# Patient Record
Sex: Female | Born: 1955 | Race: White | Hispanic: No | Marital: Married | Smoking: Never smoker
Health system: Southern US, Community
[De-identification: ages and names within clinical notes are randomized; demographics above are authoritative.]

## PROBLEM LIST (undated history)

## (undated) DIAGNOSIS — K219 Gastro-esophageal reflux disease without esophagitis: Secondary | ICD-10-CM

## (undated) DIAGNOSIS — E039 Hypothyroidism, unspecified: Secondary | ICD-10-CM

## (undated) DIAGNOSIS — G252 Other specified forms of tremor: Secondary | ICD-10-CM

## (undated) DIAGNOSIS — G709 Myoneural disorder, unspecified: Secondary | ICD-10-CM

## (undated) DIAGNOSIS — R251 Tremor, unspecified: Secondary | ICD-10-CM

## (undated) DIAGNOSIS — E7800 Pure hypercholesterolemia, unspecified: Secondary | ICD-10-CM

## (undated) DIAGNOSIS — E079 Disorder of thyroid, unspecified: Secondary | ICD-10-CM

## (undated) DIAGNOSIS — M1990 Unspecified osteoarthritis, unspecified site: Secondary | ICD-10-CM

## (undated) DIAGNOSIS — R011 Cardiac murmur, unspecified: Secondary | ICD-10-CM

## (undated) HISTORY — PX: ADENOIDECTOMY: SUR15

## (undated) HISTORY — DX: Tremor, unspecified: R25.1

---

## 2006-10-29 HISTORY — PX: COLONOSCOPY: SHX5424

## 2007-07-02 ENCOUNTER — Ambulatory Visit: Payer: Self-pay | Admitting: Unknown Physician Specialty

## 2009-03-22 ENCOUNTER — Ambulatory Visit: Payer: Self-pay | Admitting: Family Medicine

## 2009-04-20 ENCOUNTER — Ambulatory Visit: Payer: Self-pay | Admitting: Gastroenterology

## 2009-04-20 DIAGNOSIS — R933 Abnormal findings on diagnostic imaging of other parts of digestive tract: Secondary | ICD-10-CM | POA: Insufficient documentation

## 2009-04-20 DIAGNOSIS — R1013 Epigastric pain: Secondary | ICD-10-CM | POA: Insufficient documentation

## 2009-05-10 ENCOUNTER — Telehealth (INDEPENDENT_AMBULATORY_CARE_PROVIDER_SITE_OTHER): Payer: Self-pay | Admitting: *Deleted

## 2009-05-10 ENCOUNTER — Encounter: Payer: Self-pay | Admitting: Gastroenterology

## 2009-05-10 ENCOUNTER — Ambulatory Visit: Payer: Self-pay | Admitting: Gastroenterology

## 2009-05-17 ENCOUNTER — Encounter: Payer: Self-pay | Admitting: Gastroenterology

## 2009-10-19 ENCOUNTER — Ambulatory Visit: Payer: Self-pay | Admitting: Unknown Physician Specialty

## 2009-10-26 ENCOUNTER — Ambulatory Visit: Payer: Self-pay | Admitting: Unknown Physician Specialty

## 2010-06-29 IMAGING — RF DG UGI W/O KUB
1 series · 12 of 12 positions shown · non-contrast
Comparison: none

REASON FOR EXAM: H Pyloric Emesis
COMMENTS:

PROCEDURE:     FL  - FL UPPER GI  - March 22, 2009  [DATE]
RESULT:      Esophageal mucosal pattern and peristaltic activity and contour
are normal. Multiple small gastric polyps are noted. Endoscopic evaluation
is suggested. The duodenum is normal.

[Series 1: run · 12 of 12 slices shown]
[im 1/12]
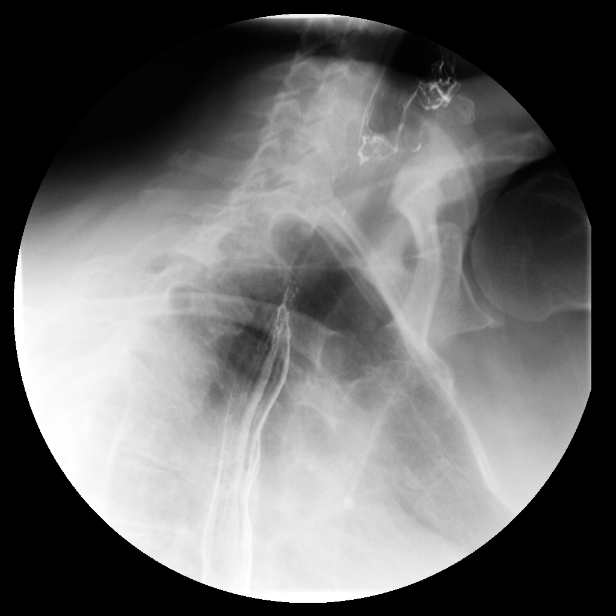
[im 2/12]
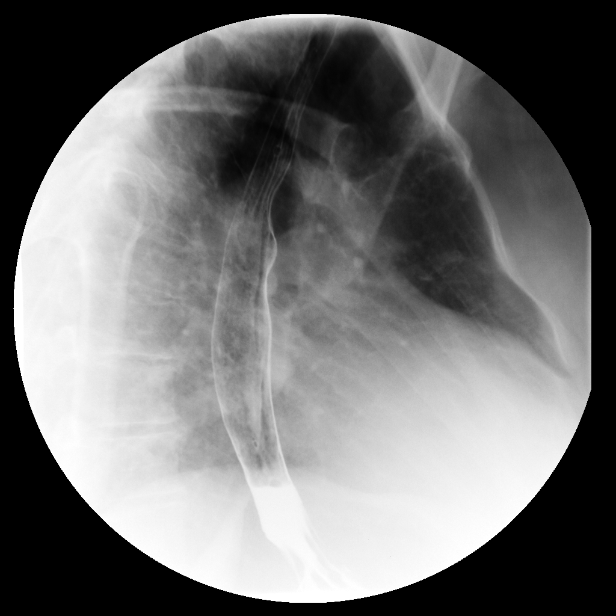
[im 3/12]
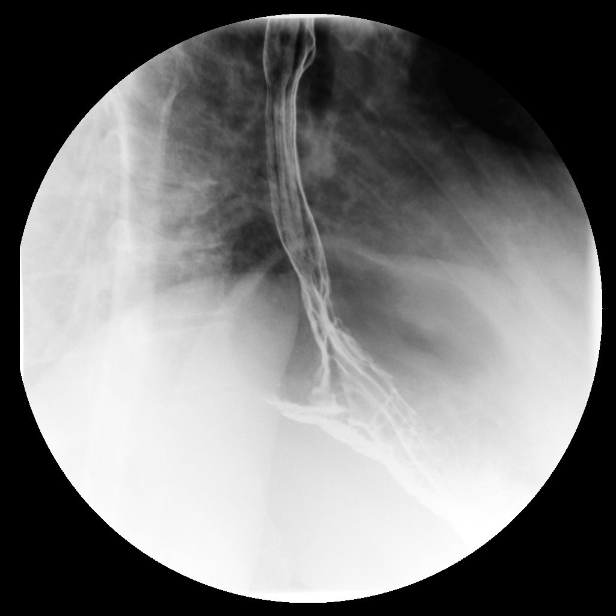
[im 4/12]
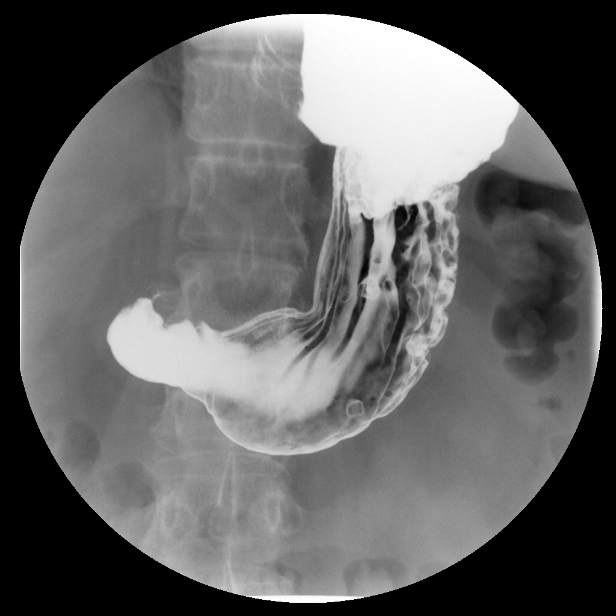
[im 5/12]
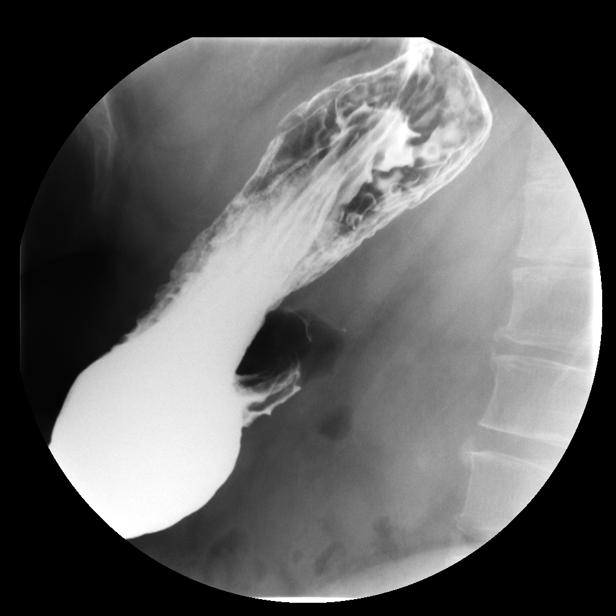
[im 6/12]
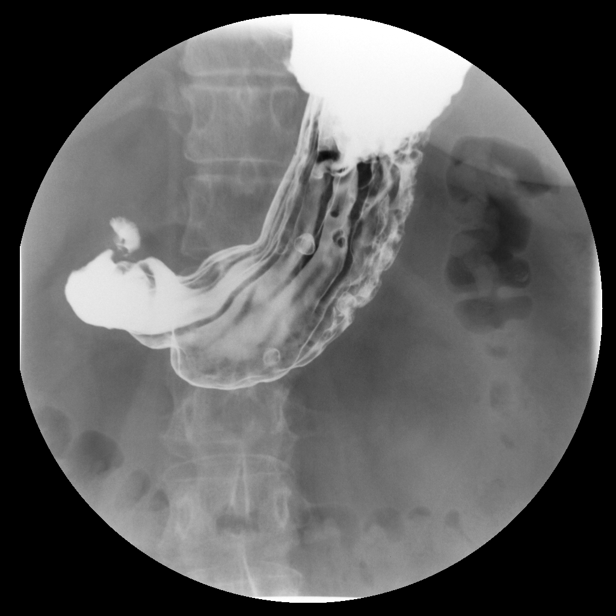
[im 7/12]
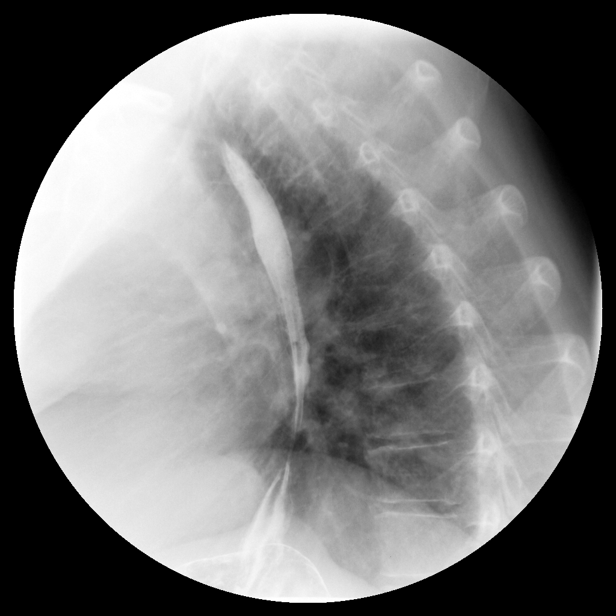
[im 8/12]
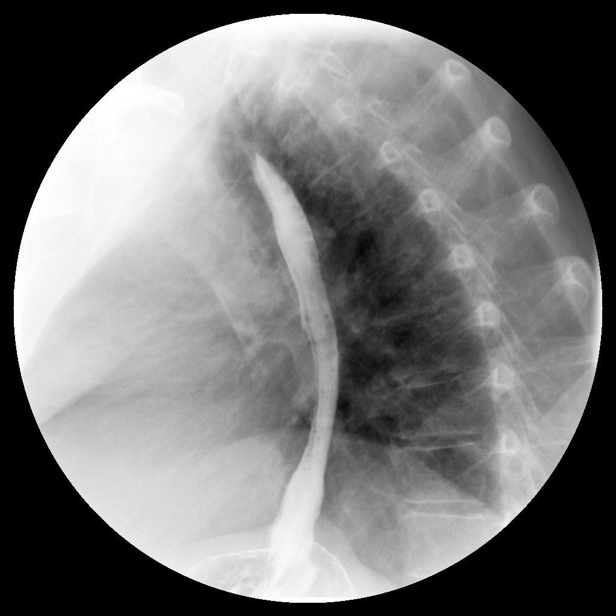
[im 9/12]
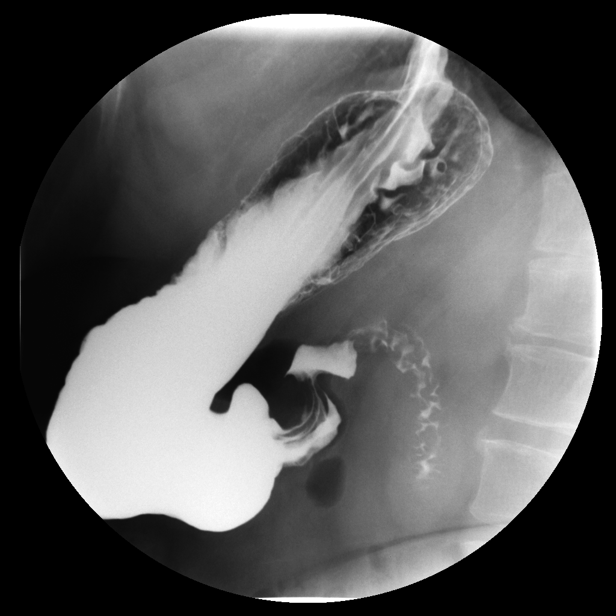
[im 10/12]
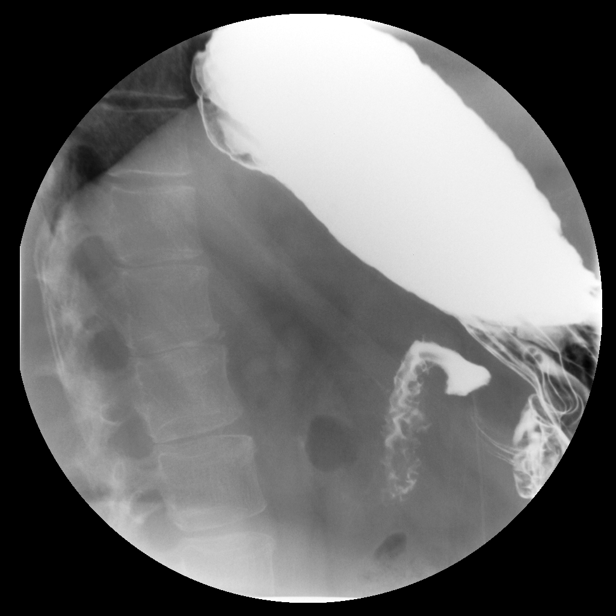
[im 11/12]
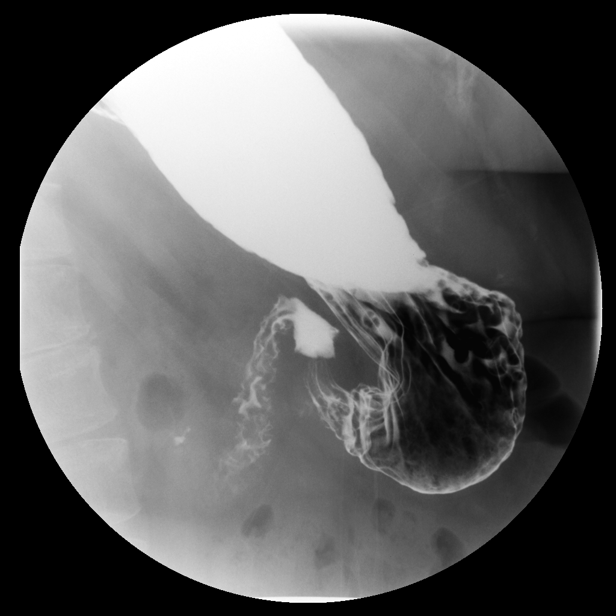
[im 12/12]
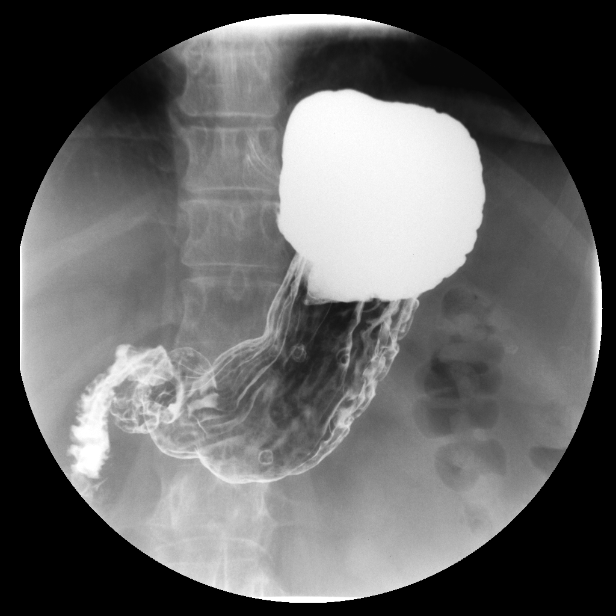

[12 of 12 positions shown; findings below may reference images not displayed]

IMPRESSION: Gastric polyposis.  Upper endoscopy suggested for further evaluation.

## 2011-07-19 ENCOUNTER — Ambulatory Visit: Payer: Self-pay | Admitting: Unknown Physician Specialty

## 2011-08-01 ENCOUNTER — Ambulatory Visit: Payer: Self-pay | Admitting: Family Medicine

## 2012-08-25 ENCOUNTER — Ambulatory Visit: Payer: Self-pay | Admitting: Medical

## 2014-02-27 ENCOUNTER — Encounter: Payer: Self-pay | Admitting: Gastroenterology

## 2014-11-16 ENCOUNTER — Ambulatory Visit: Payer: Self-pay | Admitting: Unknown Physician Specialty

## 2015-03-27 ENCOUNTER — Encounter: Payer: Self-pay | Admitting: Gynecology

## 2015-03-27 ENCOUNTER — Ambulatory Visit
Admission: EM | Admit: 2015-03-27 | Discharge: 2015-03-27 | Disposition: A | Discharge status: Home / Self Care | Payer: No Typology Code available for payment source | Attending: Family Medicine | Admitting: Family Medicine

## 2015-03-27 DIAGNOSIS — S46912A Strain of unspecified muscle, fascia and tendon at shoulder and upper arm level, left arm, initial encounter: Secondary | ICD-10-CM | POA: Diagnosis not present

## 2015-03-27 DIAGNOSIS — M7522 Bicipital tendinitis, left shoulder: Secondary | ICD-10-CM

## 2015-03-27 HISTORY — DX: Other specified forms of tremor: G25.2

## 2015-03-27 HISTORY — DX: Disorder of thyroid, unspecified: E07.9

## 2015-03-27 HISTORY — DX: Gastro-esophageal reflux disease without esophagitis: K21.9

## 2015-03-27 HISTORY — DX: Pure hypercholesterolemia, unspecified: E78.00

## 2015-03-27 MED ORDER — KETOROLAC TROMETHAMINE 10 MG PO TABS: 10.0000 mg | ORAL_TABLET | Freq: Three times a day (TID) | ORAL | Status: DC | PRN

## 2015-03-27 MED ORDER — HYDROCODONE-ACETAMINOPHEN 5-325 MG PO TABS: 1.0000 | ORAL_TABLET | Freq: Four times a day (QID) | ORAL | Status: DC | PRN

## 2015-03-27 MED ORDER — KETOROLAC TROMETHAMINE 60 MG/2ML IM SOLN
60.0000 mg | Freq: Once | INTRAMUSCULAR | Status: AC
  Administered 2015-03-27: 60 mg via INTRAMUSCULAR

## 2015-03-27 NOTE — ED Provider Notes (Signed)
CSN: [id number]     Arrival date & time 03/27/15  1201 History   First MD Initiated Contact with Patient 03/27/15 1345     Chief Complaint  Patient presents with  . Shoulder Pain   (Consider location/radiation/quality/duration/timing/severity/associated sxs/prior Treatment) Patient is a 59 y.o. female presenting with shoulder pain.  Shoulder Pain Location:  Shoulder Injury: no   Shoulder location:  L shoulder Pain details:    Quality:  Aching (worse with movement)   Radiates to:  Back   Severity:  Severe   Onset quality:  Sudden (woke up with the pain one week ago)   Progression:  Worsening Chronicity:  New Handedness:  Ambidextrous Dislocation: no   Relieved by:  NSAIDs (icy hot) Worsened by:  Movement Associated symptoms comment:  Denies chest pains or shortness of breath   Past Medical History  Diagnosis Date  . Thyroid disease   . Hypercholesteremia   . Dystonic tremor   . GERD (gastroesophageal reflux disease)    Past Surgical History  Procedure Laterality Date  . Adenoidectomy     No family history on file. History  Substance Use Topics  . Smoking status: Never Smoker   . Smokeless tobacco: Not on file  . Alcohol Use: No   OB History    No data available     Review of Systems  Allergies  Review of patient's allergies indicates no known allergies.  Home Medications   Prior to Admission medications   Medication Sig Start Date End Date Taking? Authorizing Provider  atorvastatin (LIPITOR) 10 MG tablet Take 10 mg by mouth daily.   Yes Historical Provider, MD  levothyroxine (SYNTHROID, LEVOTHROID) 112 MCG tablet Take 112 mcg by mouth daily before breakfast.   Yes Historical Provider, MD  omeprazole (PRILOSEC) 40 MG capsule Take 40 mg by mouth daily.   Yes Historical Provider, MD  propranolol (INDERAL) 80 MG tablet Take 80 mg by mouth 3 (three) times daily.   Yes Historical Provider, MD  HYDROcodone-acetaminophen (NORCO/VICODIN) 5-325 MG per tablet Take  1-2 tablets by mouth every 6 (six) hours as needed. 03/27/15   Payton Mccallumrlando Breyer Tejera, MD  ketorolac (TORADOL) 10 MG tablet Take 1 tablet (10 mg total) by mouth every 8 (eight) hours as needed. 03/27/15   Payton Mccallumrlando Nikeshia Keetch, MD   BP 124/74 mmHg  Pulse 68  Temp(Src) 98 F (36.7 C) (Oral)  Ht 5\' 4"  (1.626 m)  Wt 194 lb (87.998 kg)  BMI 33.28 kg/m2  SpO2 98% Physical Exam  Constitutional: She appears well-developed and well-nourished.  Cardiovascular: Normal rate, regular rhythm and normal heart sounds.   Pulmonary/Chest: Effort normal and breath sounds normal. No respiratory distress.  Musculoskeletal: She exhibits tenderness.       Left shoulder: She exhibits decreased range of motion, tenderness and spasm. She exhibits no swelling, no effusion, no crepitus, no deformity, no laceration, normal pulse and normal strength.  Decrease abduction of left shoulder due to pain; positive pain with biceps tendon testing; positive supraspinatus isolation test  Skin: She is not diaphoretic.  Nursing note and vitals reviewed.   ED Course  Procedures (including critical care time) Labs Review Labs Reviewed - No data to display  Imaging Review No results found.   MDM   1. Left shoulder strain, initial encounter   2. Biceps tendonitis on left    Discharge Medication List as of 03/27/2015  2:47 PM    START taking these medications   Details  HYDROcodone-acetaminophen (NORCO/VICODIN) 5-325 MG per tablet Take  1-2 tablets by mouth every 6 (six) hours as needed., Starting 03/27/2015, Until Discontinued, Print    ketorolac (TORADOL) 10 MG tablet Take 1 tablet (10 mg total) by mouth every 8 (eight) hours as needed., Starting 03/27/2015, Until Discontinued, Normal      Plan: 1. Diagnosis reviewed with patient 2. rx as per orders; risks, benefits, potential side effects reviewed with patient 3. Recommend supportive treatment with ROM exercises, gentle stretching, heat/ice 4. Patient given Toradol  IM x1 with  improvement of symptoms 5. F/u prn if symptoms worsen or don't improve    Payton Mccallum, MD 03/27/15 1451

## 2015-03-27 NOTE — ED Notes (Signed)
Patient c/o left shoulder / arm pain x 1 week ago. Pt. Stated no injury to left arm.

## 2015-04-07 ENCOUNTER — Other Ambulatory Visit: Payer: Self-pay | Admitting: Family Medicine

## 2015-04-07 DIAGNOSIS — E785 Hyperlipidemia, unspecified: Secondary | ICD-10-CM

## 2015-04-07 DIAGNOSIS — E039 Hypothyroidism, unspecified: Secondary | ICD-10-CM

## 2015-07-05 ENCOUNTER — Other Ambulatory Visit: Payer: Self-pay | Admitting: Family Medicine

## 2015-07-05 DIAGNOSIS — E039 Hypothyroidism, unspecified: Secondary | ICD-10-CM

## 2015-07-05 DIAGNOSIS — E785 Hyperlipidemia, unspecified: Secondary | ICD-10-CM

## 2015-07-08 ENCOUNTER — Other Ambulatory Visit: Payer: Self-pay | Admitting: Family Medicine

## 2015-07-25 ENCOUNTER — Encounter: Payer: Self-pay | Admitting: Family Medicine

## 2015-07-25 ENCOUNTER — Ambulatory Visit (INDEPENDENT_AMBULATORY_CARE_PROVIDER_SITE_OTHER): Payer: No Typology Code available for payment source | Admitting: Family Medicine

## 2015-07-25 VITALS — BP 100/62 | HR 68 | Ht 66.0 in | Wt 200.0 lb

## 2015-07-25 DIAGNOSIS — E785 Hyperlipidemia, unspecified: Secondary | ICD-10-CM

## 2015-07-25 DIAGNOSIS — K219 Gastro-esophageal reflux disease without esophagitis: Secondary | ICD-10-CM | POA: Diagnosis not present

## 2015-07-25 DIAGNOSIS — R1310 Dysphagia, unspecified: Secondary | ICD-10-CM | POA: Diagnosis not present

## 2015-07-25 DIAGNOSIS — K120 Recurrent oral aphthae: Secondary | ICD-10-CM

## 2015-07-25 DIAGNOSIS — E039 Hypothyroidism, unspecified: Secondary | ICD-10-CM | POA: Diagnosis not present

## 2015-07-25 MED ORDER — ATORVASTATIN CALCIUM 10 MG PO TABS: ORAL_TABLET | ORAL | Status: DC

## 2015-07-25 MED ORDER — OMEPRAZOLE 40 MG PO CPDR: 40.0000 mg | DELAYED_RELEASE_CAPSULE | Freq: Every day | ORAL | Status: DC

## 2015-07-25 MED ORDER — LEVOTHYROXINE SODIUM 112 MCG PO TABS
ORAL_TABLET | ORAL | Status: DC
Start: 2015-07-25 — End: 2016-05-03

## 2015-07-25 MED ORDER — VALACYCLOVIR HCL 500 MG PO TABS: 500.0000 mg | ORAL_TABLET | Freq: Two times a day (BID) | ORAL | Status: DC

## 2015-07-25 NOTE — Progress Notes (Signed)
Name: Cheryl Campos   MRN: [id number]    DOB: Jun 17, 1956   Date:07/25/2015       Progress Note  Subjective  Chief Complaint  Chief Complaint  Patient presents with  . Hypothyroidism  . Hyperlipidemia  . Mouth Lesions    takes Valacyclovir    Hyperlipidemia This is a chronic problem. The current episode started more than 1 year ago. The problem is controlled. Recent lipid tests were reviewed and are normal. She has no history of chronic renal disease, diabetes, hypothyroidism, liver disease, obesity or nephrotic syndrome. There are no known factors aggravating her hyperlipidemia. Pertinent negatives include no chest pain, focal sensory loss, focal weakness, leg pain, myalgias or shortness of breath. Current antihyperlipidemic treatment includes statins. The current treatment provides mild improvement of lipids. There are no compliance problems.   Mouth Lesions  The problem has been gradually improving. The problem is mild. The symptoms are relieved by one or more prescription drugs. Associated symptoms include mouth sores. Pertinent negatives include no fever, no decreased vision, no abdominal pain, no constipation, no diarrhea, no nausea, no ear discharge, no ear pain, no headaches, no sore throat, no neck pain, no cough, no wheezing and no rash.  Thyroid Problem Presents for follow-up visit. Symptoms include tremors. Patient reports no anxiety, cold intolerance, constipation, depressed mood, diaphoresis, diarrhea, fatigue, heat intolerance, hoarse voice, menstrual problem, palpitations, visual change, weight gain or weight loss. The symptoms have been stable. Past treatments include levothyroxine. The treatment provided mild relief. Her past medical history is significant for hyperlipidemia. There is no history of diabetes.  Gastrophageal Reflux She complains of dysphagia. She reports no abdominal pain, no belching, no chest pain, no choking, no coughing, no early satiety, no globus sensation,  no heartburn, no hoarse voice, no nausea, no sore throat, no stridor, no tooth decay or no wheezing. dysphagia x2-3 yearss. The problem occurs frequently. The problem has been waxing and waning. The symptoms are aggravated by certain foods. Pertinent negatives include no fatigue, melena or weight loss.    No problem-specific assessment & plan notes found for this encounter.   Past Medical History  Diagnosis Date  . Thyroid disease   . Hypercholesteremia   . Dystonic tremor   . GERD (gastroesophageal reflux disease)   . Tremors of nervous system     Past Surgical History  Procedure Laterality Date  . Adenoidectomy    . Colonoscopy  2008    [place]    Family History  Problem Relation Age of Onset  . Cancer Father   . Heart disease Father   . Cancer Brother   . Diabetes Paternal Grandmother     Social History   Social History  . Marital Status: Married    Spouse Name: N/A  . Number of Children: N/A  . Years of Education: N/A   Occupational History  . Not on file.   Social History Main Topics  . Smoking status: Never Smoker   . Smokeless tobacco: Not on file  . Alcohol Use: No  . Drug Use: No  . Sexual Activity: Yes   Other Topics Concern  . Not on file   Social History Narrative    No Known Allergies   Review of Systems  Constitutional: Negative for fever, chills, weight loss, weight gain, malaise/fatigue, diaphoresis and fatigue.  HENT: Positive for mouth sores. Negative for ear discharge, ear pain, hoarse voice and sore throat.   Eyes: Negative for blurred vision.  Respiratory: Negative for cough,  sputum production, choking, shortness of breath and wheezing.   Cardiovascular: Negative for chest pain, palpitations and leg swelling.  Gastrointestinal: Positive for dysphagia. Negative for heartburn, nausea, abdominal pain, diarrhea, constipation, blood in stool and melena.  Genitourinary: Negative for dysuria, urgency, frequency, hematuria and menstrual  problem.  Musculoskeletal: Negative for myalgias, back pain, joint pain and neck pain.  Skin: Negative for rash.  Neurological: Positive for tremors. Negative for dizziness, tingling, sensory change, focal weakness and headaches.  Endo/Heme/Allergies: Negative for environmental allergies, cold intolerance, heat intolerance and polydipsia. Does not bruise/bleed easily.  Psychiatric/Behavioral: Negative for depression and suicidal ideas. The patient is not nervous/anxious and does not have insomnia.      Objective  Filed Vitals:   07/25/15 1003  BP: 100/62  Pulse: 68  Height:  (1.676 m)  Weight: 200 lb (90.719 kg)    Physical Exam  Constitutional: She is well-developed, well-nourished, and in no distress. No distress.  HENT:  Head: Normocephalic and atraumatic.  Right Ear: External ear normal.  Left Ear: External ear normal.  Nose: Nose normal.  Mouth/Throat: Oropharynx is clear and moist.  Eyes: Conjunctivae and EOM are normal. Pupils are equal, round, and reactive to light. Right eye exhibits no discharge. Left eye exhibits no discharge.  Neck: Normal range of motion. Neck supple. No JVD present. No thyromegaly present.  Cardiovascular: Normal rate, regular rhythm, normal heart sounds and intact distal pulses.  Exam reveals no gallop and no friction rub.   No murmur heard. Pulmonary/Chest: Effort normal and breath sounds normal.  Abdominal: Soft. Bowel sounds are normal. She exhibits no mass. There is no tenderness. There is no guarding.  Musculoskeletal: Normal range of motion. She exhibits no edema.  Lymphadenopathy:    She has no cervical adenopathy.  Neurological: She is alert. She has normal reflexes.  Skin: Skin is warm and dry. She is not diaphoretic.  Psychiatric: Mood and affect normal.      Assessment & Plan  Problem List Items Addressed This Visit    None    Visit Diagnoses    Hyperlipidemia    -  Primary    Relevant Medications    atorvastatin  (LIPITOR) 10 MG tablet    Other Relevant Orders    Renal Function Panel    Lipid Profile    Gastroesophageal reflux disease, esophagitis presence not specified        also notes dysphagia/ pt to call with GI info    Relevant Medications    omeprazole (PRILOSEC) 40 MG capsule    Hypothyroidism, unspecified hypothyroidism type        Relevant Medications    levothyroxine (SYNTHROID, LEVOTHROID) 112 MCG tablet    Other Relevant Orders    TSH    Stomatitis herpetiformis        Relevant Medications    valACYclovir (VALTREX) 500 MG tablet    Dysphagia        pt to call with GI info         Dr. Hayden [facility]  07/25/2015

## 2015-07-26 LAB — RENAL FUNCTION PANEL
ALBUMIN: 4.5 g/dL (ref 3.5–5.5)
BUN/Creatinine Ratio: 22 (ref 9–23)
BUN: 12 mg/dL (ref 6–24)
CHLORIDE: 102 mmol/L (ref 97–108)
CO2: 26 mmol/L (ref 18–29)
Calcium: 9.3 mg/dL (ref 8.7–10.2)
Creatinine, Ser: 0.55 mg/dL — ABNORMAL LOW (ref 0.57–1.00)
GFR, EST AFRICAN AMERICAN: 120 mL/min/{1.73_m2} (ref >59)
GFR, EST NON AFRICAN AMERICAN: 104 mL/min/{1.73_m2} (ref >59)
Glucose: 96 mg/dL (ref 65–99)
PHOSPHORUS: 3.8 mg/dL (ref 2.5–4.5)
Potassium: 4.5 mmol/L (ref 3.5–5.2)
Sodium: 143 mmol/L (ref 134–144)

## 2015-07-26 LAB — LIPID PANEL
CHOL/HDL RATIO: 3.5 ratio (ref 0.0–4.4)
Cholesterol, Total: 177 mg/dL (ref 100–199)
HDL: 50 mg/dL (ref >39)
LDL Calculated: 95 mg/dL (ref 0–99)
Triglycerides: 159 mg/dL — ABNORMAL HIGH (ref 0–149)
VLDL CHOLESTEROL CAL: 32 mg/dL (ref 5–40)

## 2015-07-26 LAB — TSH: TSH: 1.36 u[IU]/mL (ref 0.450–4.500)

## 2015-08-05 ENCOUNTER — Other Ambulatory Visit: Payer: Self-pay | Admitting: Family Medicine

## 2016-01-30 ENCOUNTER — Encounter: Payer: Self-pay | Admitting: Family Medicine

## 2016-01-30 ENCOUNTER — Ambulatory Visit (INDEPENDENT_AMBULATORY_CARE_PROVIDER_SITE_OTHER): Payer: Managed Care, Other (non HMO) | Admitting: Family Medicine

## 2016-01-30 VITALS — BP 118/80 | HR 78 | Ht 66.0 in | Wt 209.0 lb

## 2016-01-30 DIAGNOSIS — R0789 Other chest pain: Secondary | ICD-10-CM | POA: Diagnosis not present

## 2016-01-30 DIAGNOSIS — R000 Tachycardia, unspecified: Secondary | ICD-10-CM

## 2016-01-30 DIAGNOSIS — R079 Chest pain, unspecified: Secondary | ICD-10-CM

## 2016-01-30 NOTE — Progress Notes (Signed)
Name: Cheryl Campos   MRN: [id number]    DOB: 1956-10-04   Date:01/30/2016       Progress Note  Subjective  Chief Complaint  Chief Complaint  Patient presents with  . Tachycardia    wakes up in the middle of night with heart racing, feeling jittery and nervous- has had 12 episodes. As long as "I'm busy, I don't feel it."    Chest Pain  This is a new problem. The current episode started 1 to 4 weeks ago. The onset quality is sudden. The problem occurs intermittently. The problem has been gradually worsening. The pain is present in the substernal region. The pain is at a severity of 6/10. The pain is moderate. The quality of the pain is described as heavy (sharp). The pain radiates to the left jaw and left neck. Associated symptoms include dizziness, exertional chest pressure and palpitations. Pertinent negatives include no abdominal pain, back pain, cough, diaphoresis, fever, headaches, malaise/fatigue, nausea, shortness of breath or sputum production. Past medical history comments: heart murmur Prior diagnostic workup includes echocardiogram and stress echo.  Heart Problem This is a new (tachycardia) problem. The current episode started 1 to 4 weeks ago. The problem occurs every several days. Associated symptoms include chest pain and neck pain. Pertinent negatives include no abdominal pain, chills, coughing, diaphoresis, fever, headaches, myalgias, nausea, rash or sore throat. Nothing aggravates the symptoms. She has tried nothing for the symptoms.    No problem-specific assessment & plan notes found for this encounter.   Past Medical History  Diagnosis Date  . Thyroid disease   . Hypercholesteremia   . Dystonic tremor   . GERD (gastroesophageal reflux disease)   . Tremors of nervous system     Past Surgical History  Procedure Laterality Date  . Adenoidectomy    . Colonoscopy  2008    [place]    Family History  Problem Relation Age of Onset  . Cancer Father   . Heart disease  Father   . Cancer Brother   . Diabetes Paternal Grandmother     Social History   Social History  . Marital Status: Married    Spouse Name: N/A  . Number of Children: N/A  . Years of Education: N/A   Occupational History  . Not on file.   Social History Main Topics  . Smoking status: Never Smoker   . Smokeless tobacco: Not on file  . Alcohol Use: No  . Drug Use: No  . Sexual Activity: Yes   Other Topics Concern  . Not on file   Social History Narrative    No Known Allergies   Review of Systems  Constitutional: Negative for fever, chills, weight loss, malaise/fatigue and diaphoresis.  HENT: Negative for ear discharge, ear pain and sore throat.   Eyes: Negative for blurred vision.  Respiratory: Negative for cough, sputum production, shortness of breath and wheezing.   Cardiovascular: Positive for chest pain and palpitations. Negative for leg swelling.  Gastrointestinal: Negative for heartburn, nausea, abdominal pain, diarrhea, constipation, blood in stool and melena.  Genitourinary: Negative for dysuria, urgency, frequency and hematuria.  Musculoskeletal: Positive for neck pain. Negative for myalgias, back pain and joint pain.  Skin: Negative for rash.  Neurological: Positive for dizziness. Negative for tingling, sensory change, focal weakness and headaches.  Endo/Heme/Allergies: Negative for environmental allergies and polydipsia. Does not bruise/bleed easily.  Psychiatric/Behavioral: Negative for depression and suicidal ideas. The patient is not nervous/anxious and does not have insomnia.  Objective  Filed Vitals:   01/30/16 1429  BP: 118/80  Pulse: 78  Height:  (1.676 m)  Weight: 209 lb (94.802 kg)    Physical Exam  Constitutional: She is well-developed, well-nourished, and in no distress. No distress.  HENT:  Head: Normocephalic and atraumatic.  Right Ear: External ear normal.  Left Ear: External ear normal.  Nose: Nose normal.  Mouth/Throat:  Oropharynx is clear and moist.  Eyes: Conjunctivae and EOM are normal. Pupils are equal, round, and reactive to light. Right eye exhibits no discharge. Left eye exhibits no discharge.  Neck: Normal range of motion. Neck supple. No JVD present. No thyromegaly present.  Cardiovascular: Normal rate, regular rhythm, normal heart sounds and intact distal pulses.  Exam reveals no gallop and no friction rub.   No murmur heard. Pulmonary/Chest: Effort normal and breath sounds normal. No respiratory distress. She has no wheezes. She has no rales. She exhibits no tenderness.  Abdominal: Soft. Bowel sounds are normal. She exhibits no mass. There is no tenderness. There is no guarding.  Musculoskeletal: Normal range of motion. She exhibits no edema.  Lymphadenopathy:    She has no cervical adenopathy.  Neurological: She is alert. She has normal reflexes.  Skin: Skin is warm and dry. She is not diaphoretic.  Psychiatric: Mood and affect normal.  Nursing note and vitals reviewed.     Assessment & Plan  Problem List Items Addressed This Visit    None    Visit Diagnoses    Tachycardia    -  Primary    Relevant Orders    EKG 12-Lead (Completed)    Ambulatory referral to Cardiology    Thyroid Panel With TSH    Chest pain of uncertain etiology        Relevant Orders    Ambulatory referral to Cardiology         Dr. Elizabeth Sauer [facility]  01/30/2016

## 2016-01-31 LAB — THYROID PANEL WITH TSH
FREE THYROXINE INDEX: 3.5 (ref 1.2–4.9)
T3 Uptake Ratio: 29 % (ref 24–39)
T4, Total: 11.9 ug/dL (ref 4.5–12.0)
TSH: 2.33 u[IU]/mL (ref 0.450–4.500)

## 2016-02-09 ENCOUNTER — Other Ambulatory Visit: Payer: Self-pay

## 2016-05-03 ENCOUNTER — Other Ambulatory Visit: Payer: Self-pay

## 2016-05-03 DIAGNOSIS — K219 Gastro-esophageal reflux disease without esophagitis: Secondary | ICD-10-CM

## 2016-05-03 DIAGNOSIS — E039 Hypothyroidism, unspecified: Secondary | ICD-10-CM

## 2016-05-03 MED ORDER — LEVOTHYROXINE SODIUM 112 MCG PO TABS: ORAL_TABLET | ORAL | Status: DC

## 2016-05-03 MED ORDER — OMEPRAZOLE 40 MG PO CPDR: 40.0000 mg | DELAYED_RELEASE_CAPSULE | Freq: Every day | ORAL | Status: DC

## 2016-05-15 ENCOUNTER — Other Ambulatory Visit: Payer: Self-pay

## 2016-05-15 DIAGNOSIS — E785 Hyperlipidemia, unspecified: Secondary | ICD-10-CM

## 2016-05-15 MED ORDER — ATORVASTATIN CALCIUM 10 MG PO TABS
ORAL_TABLET | ORAL | Status: DC
Start: 2016-05-15 — End: 2016-06-11

## 2016-06-11 ENCOUNTER — Ambulatory Visit (INDEPENDENT_AMBULATORY_CARE_PROVIDER_SITE_OTHER): Payer: Managed Care, Other (non HMO) | Admitting: Family Medicine

## 2016-06-11 ENCOUNTER — Encounter: Payer: Self-pay | Admitting: Family Medicine

## 2016-06-11 VITALS — BP 120/70 | HR 78 | Ht 66.0 in | Wt 207.0 lb

## 2016-06-11 DIAGNOSIS — K120 Recurrent oral aphthae: Secondary | ICD-10-CM | POA: Diagnosis not present

## 2016-06-11 DIAGNOSIS — G252 Other specified forms of tremor: Secondary | ICD-10-CM | POA: Diagnosis not present

## 2016-06-11 DIAGNOSIS — E039 Hypothyroidism, unspecified: Secondary | ICD-10-CM | POA: Diagnosis not present

## 2016-06-11 DIAGNOSIS — R251 Tremor, unspecified: Secondary | ICD-10-CM | POA: Insufficient documentation

## 2016-06-11 DIAGNOSIS — B001 Herpesviral vesicular dermatitis: Secondary | ICD-10-CM | POA: Diagnosis not present

## 2016-06-11 DIAGNOSIS — K219 Gastro-esophageal reflux disease without esophagitis: Secondary | ICD-10-CM | POA: Diagnosis not present

## 2016-06-11 DIAGNOSIS — E785 Hyperlipidemia, unspecified: Secondary | ICD-10-CM

## 2016-06-11 MED ORDER — ATORVASTATIN CALCIUM 10 MG PO TABS: ORAL_TABLET | ORAL | 6 refills | Status: DC

## 2016-06-11 MED ORDER — OMEPRAZOLE 40 MG PO CPDR: 40.0000 mg | DELAYED_RELEASE_CAPSULE | Freq: Every day | ORAL | 6 refills | Status: DC

## 2016-06-11 MED ORDER — VALACYCLOVIR HCL 500 MG PO TABS: 500.0000 mg | ORAL_TABLET | Freq: Two times a day (BID) | ORAL | 1 refills | Status: DC

## 2016-06-11 MED ORDER — LEVOTHYROXINE SODIUM 112 MCG PO TABS: ORAL_TABLET | ORAL | 6 refills | Status: DC

## 2016-06-11 NOTE — Progress Notes (Signed)
Name: Cheryl Campos   MRN: [id number]    DOB: 09/12/1956   Date:06/11/2016       Progress Note  Subjective  Chief Complaint  Chief Complaint  Patient presents with  . Gastroesophageal Reflux  . Hypothyroidism  . Hyperlipidemia    Patient presents for tremor control. Valtrex for occ fever blister   Gastroesophageal Reflux  She reports no abdominal pain, no belching, no chest pain, no choking, no coughing, no dysphagia, no early satiety, no globus sensation, no heartburn, no hoarse voice, no nausea, no sore throat, no stridor, no tooth decay, no water brash or no wheezing. This is a chronic problem. The current episode started more than 1 year ago. The problem has been gradually improving. The symptoms are aggravated by certain foods. Pertinent negatives include no anemia, fatigue, melena, muscle weakness, orthopnea or weight loss. There are no known risk factors. She has tried a PPI for the symptoms. The treatment provided no relief.  Hyperlipidemia  This is a chronic problem. The problem is controlled. Recent lipid tests were reviewed and are normal. Pertinent negatives include no chest pain, focal weakness, myalgias or shortness of breath. Current antihyperlipidemic treatment includes statins. The current treatment provides moderate improvement of lipids. There are no compliance problems.  Risk factors for coronary artery disease include post-menopausal.    No problem-specific Assessment & Plan notes found for this encounter.   Past Medical History:  Diagnosis Date  . Dystonic tremor   . GERD (gastroesophageal reflux disease)   . Hypercholesteremia   . Thyroid disease   . Tremors of nervous system     Past Surgical History:  Procedure Laterality Date  . ADENOIDECTOMY    . COLONOSCOPY  2008   [place]    Family History  Problem Relation Age of Onset  . Cancer Father   . Heart disease Father   . Cancer Brother   . Diabetes Paternal Grandmother     Social History    Social History  . Marital status: Married    Spouse name: N/A  . Number of children: N/A  . Years of education: N/A   Occupational History  . Not on file.   Social History Main Topics  . Smoking status: Never Smoker  . Smokeless tobacco: Not on file  . Alcohol use No  . Drug use: No  . Sexual activity: Yes   Other Topics Concern  . Not on file   Social History Narrative  . No narrative on file    No Known Allergies   Review of Systems  Constitutional: Negative for chills, fatigue, fever, malaise/fatigue and weight loss.  HENT: Negative for ear discharge, ear pain, hoarse voice and sore throat.   Eyes: Negative for blurred vision.  Respiratory: Negative for cough, sputum production, choking, shortness of breath and wheezing.   Cardiovascular: Negative for chest pain, palpitations and leg swelling.  Gastrointestinal: Negative for abdominal pain, blood in stool, constipation, diarrhea, dysphagia, heartburn, melena and nausea.  Genitourinary: Negative for dysuria, frequency, hematuria and urgency.  Musculoskeletal: Negative for back pain, joint pain, myalgias, muscle weakness and neck pain.  Skin: Negative for rash.  Neurological: Negative for dizziness, tingling, sensory change, focal weakness and headaches.  Endo/Heme/Allergies: Negative for environmental allergies and polydipsia. Does not bruise/bleed easily.  Psychiatric/Behavioral: Negative for depression and suicidal ideas. The patient is not nervous/anxious and does not have insomnia.      Objective  Vitals:   06/11/16 1018  BP: 120/70  Pulse: 78  Weight:  207 lb (93.9 kg)  Height: 5\' 6"  (1.676 m)    Physical Exam  Constitutional: She is well-developed, well-nourished, and in no distress. No distress.  HENT:  Head: Normocephalic and atraumatic.  Right Ear: External ear normal.  Left Ear: External ear normal.  Nose: Nose normal.  Mouth/Throat: Oropharynx is clear and moist.  Eyes: Conjunctivae and EOM  are normal. Pupils are equal, round, and reactive to light. Right eye exhibits no discharge. Left eye exhibits no discharge.  Neck: Normal range of motion. Neck supple. No JVD present. No thyromegaly present.  Cardiovascular: Normal rate, regular rhythm, normal heart sounds and intact distal pulses.  Exam reveals no gallop and no friction rub.   No murmur heard. Pulmonary/Chest: Effort normal and breath sounds normal. No respiratory distress. She has no wheezes. She has no rales. She exhibits no tenderness.  Abdominal: Soft. Bowel sounds are normal. She exhibits no mass. There is no tenderness. There is no guarding.  Musculoskeletal: Normal range of motion. She exhibits no edema, tenderness or deformity.  Lymphadenopathy:    She has no cervical adenopathy.  Neurological: She is alert. She has normal reflexes.  Skin: Skin is warm and dry. No rash noted. She is not diaphoretic. No erythema. No pallor.  Psychiatric: Mood and affect normal.  Nursing note and vitals reviewed.     Assessment & Plan  Problem List Items Addressed This Visit      Digestive   Esophageal reflux   Relevant Medications   omeprazole (PRILOSEC) 40 MG capsule   Fever blister   Relevant Medications   valACYclovir (VALTREX) 500 MG tablet     Endocrine   Thyroid activity decreased   Relevant Medications   levothyroxine (SYNTHROID, LEVOTHROID) 112 MCG tablet   Other Relevant Orders   Thyroid Panel With TSH     Other   Dystonic tremor   Hyperlipidemia - Primary   Relevant Medications   atorvastatin (LIPITOR) 10 MG tablet   Other Relevant Orders   Lipid Profile    Other Visit Diagnoses    Stomatitis herpetiformis       Relevant Medications   valACYclovir (VALTREX) 500 MG tablet        Dr. Hayden Rasmusseneanna Jones [facility]  06/11/16

## 2016-06-12 LAB — THYROID PANEL WITH TSH
FREE THYROXINE INDEX: 3.1 (ref 1.2–4.9)
T3 UPTAKE RATIO: 31 % (ref 24–39)
T4 TOTAL: 10 ug/dL (ref 4.5–12.0)
TSH: 3.05 u[IU]/mL (ref 0.450–4.500)

## 2016-06-12 LAB — LIPID PANEL
CHOLESTEROL TOTAL: 165 mg/dL (ref 100–199)
Chol/HDL Ratio: 3.3 ratio units (ref 0.0–4.4)
HDL: 50 mg/dL (ref >39)
LDL Calculated: 88 mg/dL (ref 0–99)
Triglycerides: 136 mg/dL (ref 0–149)
VLDL Cholesterol Cal: 27 mg/dL (ref 5–40)

## 2017-02-25 ENCOUNTER — Other Ambulatory Visit: Payer: Self-pay | Admitting: Family Medicine

## 2017-02-25 DIAGNOSIS — E785 Hyperlipidemia, unspecified: Secondary | ICD-10-CM

## 2017-03-13 ENCOUNTER — Emergency Department (HOSPITAL_COMMUNITY)
Admission: EM | Admit: 2017-03-13 | Discharge: 2017-03-14 | Disposition: A | Discharge status: Home / Self Care | Payer: Managed Care, Other (non HMO) | Attending: Emergency Medicine | Admitting: Emergency Medicine

## 2017-03-13 DIAGNOSIS — R55 Syncope and collapse: Secondary | ICD-10-CM | POA: Diagnosis not present

## 2017-03-13 DIAGNOSIS — Z79899 Other long term (current) drug therapy: Secondary | ICD-10-CM | POA: Insufficient documentation

## 2017-03-13 NOTE — ED Provider Notes (Signed)
[facility]-EMERGENCY DEPT Provider Note   CSN: [id number] Arrival date & time: 03/13/17  2312  By signing my name below, I, Cheryl Campos, attest that this documentation has been prepared under the direction and in the presence of Cheryl Campos, Cheryl Greenlaw, MD. Electronically Signed: Karren CobbleNy'kea Campos, ED Scribe. 03/14/17. 12:10 AM.  Time seen 23:26 PM  History   Chief Complaint Chief Complaint  Patient presents with  . Near Syncope  . Nausea  . Abdominal Pain   The history is provided by the patient and a relative. No language interpreter was used.  Near Syncope  This is a new problem. The current episode started less than 1 hour ago. The problem has not changed since onset.Associated symptoms include abdominal pain.  Abdominal Pain   Associated symptoms include nausea. Pertinent negatives include diarrhea.    HPI Comments: Cheryl Campos is a 61 y.o. female with a PMHx of Thyroid diease, GERD and HLD, who presents to the Emergency Department complaining of a sudden onset of near fainting episode while waiting with family member in the ED. Her associated symptoms include centralized abdominal pain with the feeling she needs to have a BM, nausea, and sweats. Pt states she was standing in there ED room with her mother when her abdomen began hurt. She went to the restroom but was uncomfortable about using the public bathroom and decided to return to the room when she broken into a sweat and felt like she was going to pass out. Afterwards she notes having a near fainting episode. She does not note any recent changed but states she did eat later than normal. Per pt's sister, she had a similar episode a few months ago and notes when this happened it was secondary to her eating dinner later than she normally does. Pt also notes she has been more stressed than normal due to family issues. At this time all symptoms have resolved aside from feeling like she needs to go to the restroom to have a bowel movement. Denies diarrhea,  dizziness, loss of consciousness, or light headedness.   PCP- Elizabeth Sauereanna Jones   Past Medical History:  Diagnosis Date  . Dystonic tremor   . GERD (gastroesophageal reflux disease)   . Hypercholesteremia   . Thyroid disease   . Tremors of nervous system     Patient Active Problem List   Diagnosis Date Noted  . Dystonic tremor 06/11/2016  . Esophageal reflux 06/11/2016  . Hyperlipidemia 06/11/2016  . Fever blister 06/11/2016  . Thyroid activity decreased 06/11/2016  . EPIGASTRIC PAIN 04/20/2009  . NONSPECIFIC ABN FINDING RAD & OTH EXAM GI TRACT 04/20/2009    Past Surgical History:  Procedure Laterality Date  . ADENOIDECTOMY    . COLONOSCOPY  2008   [place]    OB History    No data available       Home Medications    Prior to Admission medications   Medication Sig Start Date End Date Taking? Authorizing Provider  atorvastatin (LIPITOR) 10 MG tablet TAKE (1) TABLET BY MOUTH EVERY DAY 02/25/17  Yes Duanne LimerickJones, Deanna C, MD  levothyroxine (SYNTHROID, LEVOTHROID) 112 MCG tablet TAKE 1 TABLET ON AN EMPTY STOMACH WITH A GLASS OF WATER AT LEAST 30 TO 60 MINUTES BEFORE BREAKFAST. 06/11/16  Yes Duanne LimerickJones, Deanna C, MD  omeprazole (PRILOSEC) 40 MG capsule Take 1 capsule (40 mg total) by mouth daily. 06/11/16  Yes Duanne LimerickJones, Deanna C, MD  propranolol (INDERAL) 80 MG tablet Take 80 mg by mouth 2 (two) times daily. Dr  Sherryll Burger   Yes [provider]  valACYclovir (VALTREX) 500 MG tablet Take 1 tablet (500 mg total) by mouth 2 (two) times daily. As needed for flare up Patient not taking: Reported on 03/14/2017 06/11/16   Duanne Limerick, MD    Family History Family History  Problem Relation Age of Onset  . Cancer Father   . Heart disease Father   . Cancer Brother   . Diabetes Paternal Grandmother     Social History Social History  Substance Use Topics  . Smoking status: Never Smoker  . Smokeless tobacco: Not on file  . 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   Review of Systems Review of Systems  Constitutional: Positive for diaphoresis.  Cardiovascular: Positive for near-syncope.  Gastrointestinal: Positive for abdominal pain and nausea. Negative for diarrhea.  Neurological: Positive for syncope (near). Negative for dizziness and light-headedness.  All other systems reviewed and are negative.   Physical Exam Updated Vital Signs BP (!) 148/83 (BP Location: Right Arm)   SpO2 97%   ED Triage Vitals  Enc Vitals Group     BP 03/13/17 2315 (!) 148/83     Pulse Rate 03/13/17 2320 62     Resp 03/13/17 2330 18     Temp --      Temp src --      SpO2 03/13/17 2317 97 %     Weight --      Height --      Head Circumference --      Peak Flow --      Pain Score --      Pain Loc --      Pain Edu? --      Excl. in GC? --    Vital signs normal    Physical Exam  Constitutional: She is oriented to person, place, and time. She appears well-developed and well-nourished.  Non-toxic appearance. She does not appear ill. No distress.  HENT:  Head: Normocephalic and atraumatic.  Right Ear: External ear normal.  Left Ear: External ear normal.  Nose: Nose normal. No mucosal edema or rhinorrhea.  Mouth/Throat: Oropharynx is clear and moist and mucous membranes are normal. No dental abscesses or uvula swelling.  Eyes: Conjunctivae and EOM are normal. Pupils are equal, round, and reactive to light.  Neck: Normal range of motion and full passive range of motion without pain. Neck supple.  Cardiovascular: Normal rate, regular rhythm and normal heart sounds.  Exam reveals no gallop and no friction rub.   No murmur heard. Pulmonary/Chest: Effort normal and breath sounds normal. No respiratory distress. She has no wheezes. She has no rhonchi. She has no rales. She exhibits no tenderness and no crepitus.  Abdominal: Soft. Normal appearance and bowel sounds are normal. She exhibits no distension.  There is no tenderness. There is no rebound and no guarding.  Musculoskeletal: Normal range of motion. She exhibits no edema or tenderness.  Moves all extremities well.   Neurological: She is alert and oriented to person, place, and time. She has normal strength. No cranial nerve deficit.  Skin: Skin is warm, dry and intact. No rash noted. No erythema. No pallor.  Psychiatric: She has a normal mood and affect. Her speech is normal and behavior is normal. Her mood appears not anxious.  Nursing note and vitals reviewed.   ED Treatments / Results  Labs (all labs ordered are listed, but  only abnormal results are displayed) Results for orders placed or performed during the hospital encounter of 03/13/17  Basic metabolic panel  Result Value Ref Range   Sodium 139 135 - 145 mmol/L   Potassium 4.7 3.5 - 5.1 mmol/L   Chloride 107 101 - 111 mmol/L   CO2 23 22 - 32 mmol/L   Glucose, Bld 109 (H) 65 - 99 mg/dL   BUN 12 6 - 20 mg/dL   Creatinine, Ser 3.24 0.44 - 1.00 mg/dL   Calcium 9.3 8.9 - 40.1 mg/dL   GFR calc non Af Amer >60 >60 mL/min   GFR calc Af Amer >60 >60 mL/min   Anion gap 9 5 - 15  CBC with Differential  Result Value Ref Range   WBC 12.4 (H) 4.0 - 10.5 K/uL   RBC 4.74 3.87 - 5.11 MIL/uL   Hemoglobin 13.5 12.0 - 15.0 g/dL   HCT 02.7 25.3 - 66.4 %   MCV 88.2 78.0 - 100.0 fL   MCH 28.5 26.0 - 34.0 pg   MCHC 32.3 30.0 - 36.0 g/dL   RDW 40.3 47.4 - 25.9 %   Platelets 232 150 - 400 K/uL   Neutrophils Relative % 70 %   Neutro Abs 8.7 (H) 1.7 - 7.7 K/uL   Lymphocytes Relative 22 %   Lymphs Abs 2.7 0.7 - 4.0 K/uL   Monocytes Relative 7 %   Monocytes Absolute 0.9 0.1 - 1.0 K/uL   Eosinophils Relative 1 %   Eosinophils Absolute 0.1 0.0 - 0.7 K/uL   Basophils Relative 0 %   Basophils Absolute 0.0 0.0 - 0.1 K/uL   Laboratory interpretation all normal except leukocytosis    EKG  EKG Interpretation  Date/Time:  Wednesday Mar 13 2017 23:21:02 EDT Ventricular Rate:  69 PR  Interval:    QRS Duration: 98 QT Interval:  420 QTC Calculation: 450 R Axis:   11 Text Interpretation:  Sinus rhythm Left atrial enlargement PAC's  with nonconducted QRS Since last tracing rate slower 22 Jul 1997 Confirmed by [facility]  MD-I, Sharine Cadle ([id number]) on 03/14/2017 12:35:49 AM       Radiology No results found.  Procedures Procedures (including critical care time)  Medications Ordered in ED Medications - No data to display   Initial Impression / Assessment and Plan / ED Course  I have reviewed the triage vital signs and the nursing notes.  Pertinent labs & imaging results that were available during my care of the patient were reviewed by me and considered in my medical decision making (see chart for details).    DIAGNOSTIC STUDIES: Oxygen Saturation is 98% on RA, normal by my interpretation.   COORDINATION OF CARE: 11:33 PM-Discussed next steps with pt. Pt verbalized understanding and is agreeable with the plan.   Nurses report her initial blood pressure was 100/58. Patient appears to have a vasovagal near syncopal event due to her abdominal pain and nausea. Patient was allowed to go to the bathroom to have a BM which will hopefully improve her symptoms.  2:09 AM  Pt notes that she feels better but has not since used the restroom.   3:22 AM Pt had a successful bowel movement and is feeling better.  We discussed she had a vasovagal episode and she should be fine now.  Final Clinical Impressions(s) / ED Diagnoses   Final diagnoses:  Vasovagal near syncope   Plan discharge  Cheryl Albe, MD, FACEP   I personally performed the services described in this documentation,  which was scribed in my presence. The recorded information has been reviewed and considered.  Cheryl Albe, MD, Concha Pyo, MD 03/14/17 [phone]

## 2017-03-13 NOTE — ED Triage Notes (Signed)
Pt was here visiting with her mother when she became diaphoretic, nauseous, and light headed.  Pt's initial BP: 100/58, CBG: 118.

## 2017-03-14 DIAGNOSIS — R55 Syncope and collapse: Secondary | ICD-10-CM | POA: Diagnosis not present

## 2017-03-14 LAB — CBC WITH DIFFERENTIAL/PLATELET
BASOS ABS: 0 10*3/uL (ref 0.0–0.1)
Basophils Relative: 0 %
EOS ABS: 0.1 10*3/uL (ref 0.0–0.7)
EOS PCT: 1 %
HCT: 41.8 % (ref 36.0–46.0)
Hemoglobin: 13.5 g/dL (ref 12.0–15.0)
Lymphocytes Relative: 22 %
Lymphs Abs: 2.7 10*3/uL (ref 0.7–4.0)
MCH: 28.5 pg (ref 26.0–34.0)
MCHC: 32.3 g/dL (ref 30.0–36.0)
MCV: 88.2 fL (ref 78.0–100.0)
Monocytes Absolute: 0.9 10*3/uL (ref 0.1–1.0)
Monocytes Relative: 7 %
Neutro Abs: 8.7 10*3/uL — ABNORMAL HIGH (ref 1.7–7.7)
Neutrophils Relative %: 70 %
PLATELETS: 232 10*3/uL (ref 150–400)
RBC: 4.74 MIL/uL (ref 3.87–5.11)
RDW: 13.2 % (ref 11.5–15.5)
WBC: 12.4 10*3/uL — ABNORMAL HIGH (ref 4.0–10.5)

## 2017-03-14 LAB — BASIC METABOLIC PANEL
Anion gap: 9 (ref 5–15)
BUN: 12 mg/dL (ref 6–20)
CO2: 23 mmol/L (ref 22–32)
CREATININE: 0.85 mg/dL (ref 0.44–1.00)
Calcium: 9.3 mg/dL (ref 8.9–10.3)
Chloride: 107 mmol/L (ref 101–111)
GFR calc Af Amer: >60 mL/min (ref >60)
Glucose, Bld: 109 mg/dL — ABNORMAL HIGH (ref 65–99)
Potassium: 4.7 mmol/L (ref 3.5–5.1)
SODIUM: 139 mmol/L (ref 135–145)

## 2017-03-14 LAB — CBG MONITORING, ED: Glucose-Capillary: 118 mg/dL — ABNORMAL HIGH (ref 65–99)

## 2017-03-14 NOTE — Discharge Instructions (Signed)
Recheck as needed.  You had a vasovagal episode tonight while you were having the abdominal pain and nausea. You should be fine now.

## 2017-03-26 ENCOUNTER — Other Ambulatory Visit: Payer: Self-pay | Admitting: Family Medicine

## 2017-03-26 DIAGNOSIS — K120 Recurrent oral aphthae: Secondary | ICD-10-CM

## 2017-04-25 ENCOUNTER — Other Ambulatory Visit: Payer: Self-pay | Admitting: Family Medicine

## 2017-04-25 DIAGNOSIS — E785 Hyperlipidemia, unspecified: Secondary | ICD-10-CM

## 2017-05-27 ENCOUNTER — Other Ambulatory Visit: Payer: Self-pay

## 2017-05-27 ENCOUNTER — Telehealth: Payer: Self-pay | Admitting: Family Medicine

## 2017-05-27 MED ORDER — LEVOTHYROXINE SODIUM 112 MCG PO TABS: ORAL_TABLET | ORAL | 0 refills | Status: DC

## 2017-05-27 NOTE — Telephone Encounter (Signed)
Has appt for med refill on Thursday needs some medication sent it till the day she come in. levothyroxine (SYNTHROID, LEVOTHROID) 112 MCG tablet

## 2017-05-27 NOTE — Telephone Encounter (Signed)
[facility] [place]

## 2017-05-27 NOTE — Telephone Encounter (Signed)
Thank you :)

## 2017-05-30 ENCOUNTER — Encounter: Payer: Self-pay | Admitting: Family Medicine

## 2017-05-30 ENCOUNTER — Ambulatory Visit (INDEPENDENT_AMBULATORY_CARE_PROVIDER_SITE_OTHER): Payer: Managed Care, Other (non HMO) | Admitting: Family Medicine

## 2017-05-30 VITALS — BP 122/80 | HR 74 | Ht 66.0 in | Wt 214.0 lb

## 2017-05-30 DIAGNOSIS — G252 Other specified forms of tremor: Secondary | ICD-10-CM | POA: Diagnosis not present

## 2017-05-30 DIAGNOSIS — E782 Mixed hyperlipidemia: Secondary | ICD-10-CM

## 2017-05-30 DIAGNOSIS — R0989 Other specified symptoms and signs involving the circulatory and respiratory systems: Secondary | ICD-10-CM

## 2017-05-30 DIAGNOSIS — E039 Hypothyroidism, unspecified: Secondary | ICD-10-CM

## 2017-05-30 DIAGNOSIS — R1310 Dysphagia, unspecified: Secondary | ICD-10-CM | POA: Diagnosis not present

## 2017-05-30 DIAGNOSIS — K219 Gastro-esophageal reflux disease without esophagitis: Secondary | ICD-10-CM | POA: Diagnosis not present

## 2017-05-30 DIAGNOSIS — R1319 Other dysphagia: Secondary | ICD-10-CM

## 2017-05-30 MED ORDER — PROPRANOLOL HCL 80 MG PO TABS: 80.0000 mg | ORAL_TABLET | Freq: Two times a day (BID) | ORAL | 1 refills | Status: DC

## 2017-05-30 MED ORDER — LEVOTHYROXINE SODIUM 112 MCG PO TABS: ORAL_TABLET | ORAL | 1 refills | Status: DC

## 2017-05-30 MED ORDER — ATORVASTATIN CALCIUM 10 MG PO TABS: ORAL_TABLET | ORAL | 1 refills | Status: DC

## 2017-05-30 MED ORDER — OMEPRAZOLE 40 MG PO CPDR: 40.0000 mg | DELAYED_RELEASE_CAPSULE | Freq: Every day | ORAL | 1 refills | Status: DC

## 2017-05-30 NOTE — Progress Notes (Signed)
Name: Cheryl Campos   MRN: [id number]    DOB: 10/08/1956   Date:05/30/2017       Progress Note  Subjective  Chief Complaint  Chief Complaint  Patient presents with  . Hyperlipidemia  . Hypothyroidism  . Tremors    Patient present for medication refill.  Patient has noted pulsation of right side.   Hyperlipidemia  This is a chronic problem. The current episode started more than 1 month ago. The problem is controlled. Recent lipid tests were reviewed and are normal. She has no history of chronic renal disease, diabetes, hypothyroidism, liver disease, obesity or nephrotic syndrome. There are no known factors aggravating her hyperlipidemia. Pertinent negatives include no chest pain, focal sensory loss, focal weakness, leg pain, myalgias or shortness of breath. She is currently on no antihyperlipidemic treatment. The current treatment provides mild improvement of lipids. There are no compliance problems.  Risk factors for coronary artery disease include dyslipidemia.  Thyroid Problem  Presents for follow-up visit. Symptoms include weight gain. Patient reports no anxiety, cold intolerance, constipation, depressed mood, diaphoresis, diarrhea, dry skin, fatigue, hair loss, heat intolerance, hoarse voice, leg swelling, menstrual problem, nail problem, palpitations, tremors, visual change or weight loss. The symptoms have been stable. Her past medical history is significant for hyperlipidemia. There is no history of diabetes.  Gastroesophageal Reflux  She complains of dysphagia and heartburn. She reports no abdominal pain, no belching, no chest pain, no choking, no coughing, no early satiety, no hoarse voice, no nausea, no sore throat, no stridor, no tooth decay, no water brash or no wheezing. This is a chronic problem. The problem occurs constantly. The symptoms are aggravated by certain foods. Pertinent negatives include no fatigue, melena or weight loss. She has tried a PPI for the symptoms. The  treatment provided mild relief. Past procedures include an EGD. Past procedures do not include an abdominal ultrasound, esophageal manometry, esophageal pH monitoring, H. pylori antibody titer or a UGI.  Neurologic Problem  The patient's pertinent negatives include no focal sensory loss, focal weakness, slurred speech or visual change. Pertinent negatives include no abdominal pain, back pain, chest pain, diaphoresis, dizziness, fatigue, fever, headaches, nausea, neck pain, palpitations or shortness of breath. There is no history of liver disease.    No problem-specific Assessment & Plan notes found for this encounter.   Past Medical History:  Diagnosis Date  . Dystonic tremor   . GERD (gastroesophageal reflux disease)   . Hypercholesteremia   . Thyroid disease   . Tremors of nervous system     Past Surgical History:  Procedure Laterality Date  . ADENOIDECTOMY    . COLONOSCOPY  2008       Family History  Problem Relation Age of Onset  . Cancer Father   . Heart disease Father   . Cancer Brother   . Diabetes Paternal Grandmother     Social History   Social History  . Marital status: Married    Spouse name: N/A  . Number of children: N/A  . Years of education: N/A   Occupational History  . Not on file.   Social History Main Topics  . Smoking status: Never Smoker  . Smokeless tobacco: Never Used  . Alcohol use No  . Drug use: No  . Sexual activity: Yes   Other Topics Concern  . Not on file   Social History Narrative  . No narrative on file    No Known Allergies  Outpatient Medications Prior to Visit  Medication  Sig Dispense Refill  . valACYclovir (VALTREX) 500 MG tablet TAKE (1) TABLET BY MOUTH TWICE DAILY AS NEEDED FOR FLARE UP 30 tablet 0  . atorvastatin (LIPITOR) 10 MG tablet TAKE (1) TABLET BY MOUTH EVERY DAY **NEED APPOINTMENT FOR REFILLS** 30 tablet 1  . levothyroxine (SYNTHROID, LEVOTHROID) 112 MCG tablet TAKE 1 TABLET ON AN EMPTY STOMACH WITH A  GLASS OF WATER AT LEAST 30 TO 60 MINUTES BEFORE BREAKFAST. 30 tablet 0  . omeprazole (PRILOSEC) 40 MG capsule Take 1 capsule (40 mg total) by mouth daily. 30 capsule 6  . propranolol (INDERAL) 80 MG tablet Take 80 mg by mouth 2 (two) times daily. Dr Sherryll BurgerShah     No facility-administered medications prior to visit.     Review of Systems  Constitutional: Positive for weight gain. Negative for chills, diaphoresis, fatigue, fever, malaise/fatigue and weight loss.  HENT: Negative for ear discharge, ear pain, hoarse voice and sore throat.   Eyes: Negative for blurred vision.  Respiratory: Negative for cough, sputum production, choking, shortness of breath and wheezing.   Cardiovascular: Negative for chest pain, palpitations and leg swelling.  Gastrointestinal: Positive for dysphagia and heartburn. Negative for abdominal pain, blood in stool, constipation, diarrhea, melena and nausea.  Genitourinary: Negative for dysuria, frequency, hematuria, menstrual problem and urgency.  Musculoskeletal: Negative for back pain, joint pain, myalgias and neck pain.  Skin: Negative for rash.  Neurological: Negative for dizziness, tingling, tremors, sensory change, focal weakness and headaches.  Endo/Heme/Allergies: Negative for environmental allergies, cold intolerance, heat intolerance and polydipsia. Does not bruise/bleed easily.  Psychiatric/Behavioral: Negative for depression and suicidal ideas. The patient is not nervous/anxious and does not have insomnia.      Objective  Vitals:   05/30/17 0820  BP: 122/80  Pulse: 74  Weight: 214 lb (97.1 kg)  Height: 5\' 6"  (1.676 m)    Physical Exam  Constitutional: She is well-developed, well-nourished, and in no distress. No distress.  HENT:  Head: Normocephalic and atraumatic.  Right Ear: External ear normal.  Left Ear: External ear normal.  Nose: Nose normal.  Mouth/Throat: Oropharynx is clear and moist.  Eyes: Pupils are equal, round, and reactive to light.  Conjunctivae and EOM are normal. Right eye exhibits no discharge. Left eye exhibits no discharge.  Neck: Normal range of motion. Neck supple. Normal carotid pulses, no hepatojugular reflux and no JVD present. Carotid bruit is not present. No thyromegaly present.  Pulsatile area right carotid area  Cardiovascular: Normal rate, regular rhythm, normal heart sounds and intact distal pulses.  PMI is not displaced.  Exam reveals no gallop and no friction rub.   No murmur heard. Pulmonary/Chest: Effort normal and breath sounds normal. She has no wheezes. She has no rales.  Abdominal: Soft. Bowel sounds are normal. She exhibits no mass. There is no tenderness. There is no guarding.  Musculoskeletal: Normal range of motion. She exhibits no edema.  Lymphadenopathy:    She has no cervical adenopathy.  Neurological: She is alert. She has normal reflexes.  Skin: Skin is warm and dry. She is not diaphoretic.  Psychiatric: Mood and affect normal.  Nursing note and vitals reviewed.     Assessment & Plan  Problem List Items Addressed This Visit      Digestive   Esophageal reflux   Relevant Medications   omeprazole (PRILOSEC) 40 MG capsule   Other Relevant Orders   Ambulatory referral to Gastroenterology     Endocrine   Thyroid activity decreased   Relevant Medications  levothyroxine (SYNTHROID, LEVOTHROID) 112 MCG tablet   propranolol (INDERAL) 80 MG tablet   Other Relevant Orders   TSH     Other   Dystonic tremor - Primary   Hyperlipidemia   Relevant Medications   propranolol (INDERAL) 80 MG tablet   atorvastatin (LIPITOR) 10 MG tablet   Other Relevant Orders   Lipid Profile   Renal Function Panel    Other Visit Diagnoses    Esophageal dysphagia       Abnormal carotid pulse       increased right side   Relevant Orders   US Carotid Duplex Bilateral      Meds ordered this encounter  Medications  . levothyroxine (SYNTHROID, LEVOTHROID) 112 MCG tablet    Sig: TAKE 1 TABLET ON  AN EMPTY STOMACH WITH A GLASS OF WATER AT LEAST 30 TO 60 MINUTES BEFORE BREAKFAST.    Dispense:  90 tablet    Refill:  1  . propranolol (INDERAL) 80 MG tablet    Sig: Take 1 tablet (80 mg total) by mouth 2 (two) times daily. Dr Sherryll BurgerShah    Dispense:  180 tablet    Refill:  1  . atorvastatin (LIPITOR) 10 MG tablet    Sig: TAKE (1) TABLET BY MOUTH EVERY DAY **NEED APPOINTMENT FOR REFILLS**    Dispense:  90 tablet    Refill:  1  . omeprazole (PRILOSEC) 40 MG capsule    Sig: Take 1 capsule (40 mg total) by mouth daily.    Dispense:  90 capsule    Refill:  1      Dr. Elizabeth Sauereanna As[place] [facility]  05/30/17

## 2017-05-31 LAB — RENAL FUNCTION PANEL
ALBUMIN: 4.3 g/dL (ref 3.5–5.5)
BUN / CREAT RATIO: 14 (ref 9–23)
BUN: 10 mg/dL (ref 6–24)
CO2: 24 mmol/L (ref 20–29)
Calcium: 9.1 mg/dL (ref 8.7–10.2)
Chloride: 103 mmol/L (ref 96–106)
Creatinine, Ser: 0.7 mg/dL (ref 0.57–1.00)
GFR, EST AFRICAN AMERICAN: 110 mL/min/{1.73_m2} (ref >59)
GFR, EST NON AFRICAN AMERICAN: 95 mL/min/{1.73_m2} (ref >59)
GLUCOSE: 95 mg/dL (ref 65–99)
Phosphorus: 3.9 mg/dL (ref 2.5–4.5)
Potassium: 4.1 mmol/L (ref 3.5–5.2)
Sodium: 142 mmol/L (ref 134–144)

## 2017-05-31 LAB — TSH: TSH: 6.2 u[IU]/mL — AB (ref 0.450–4.500)

## 2017-05-31 LAB — LIPID PANEL
CHOLESTEROL TOTAL: 178 mg/dL (ref 100–199)
Chol/HDL Ratio: 3.6 ratio (ref 0.0–4.4)
HDL: 49 mg/dL (ref >39)
LDL CALC: 95 mg/dL (ref 0–99)
Triglycerides: 172 mg/dL — ABNORMAL HIGH (ref 0–149)
VLDL CHOLESTEROL CAL: 34 mg/dL (ref 5–40)

## 2017-06-04 ENCOUNTER — Ambulatory Visit
Admission: RE | Admit: 2017-06-04 | Discharge: 2017-06-04 | Disposition: A | Discharge status: Home / Self Care | Payer: Managed Care, Other (non HMO) | Source: Ambulatory Visit | Attending: Family Medicine | Admitting: Family Medicine

## 2017-06-04 ENCOUNTER — Other Ambulatory Visit: Payer: Self-pay

## 2017-06-04 DIAGNOSIS — R0989 Other specified symptoms and signs involving the circulatory and respiratory systems: Secondary | ICD-10-CM | POA: Diagnosis not present

## 2017-06-04 DIAGNOSIS — I6523 Occlusion and stenosis of bilateral carotid arteries: Secondary | ICD-10-CM | POA: Insufficient documentation

## 2017-06-04 MED ORDER — LEVOTHYROXINE SODIUM 125 MCG PO TABS: 125.0000 ug | ORAL_TABLET | Freq: Every day | ORAL | 1 refills | Status: DC

## 2017-06-06 ENCOUNTER — Other Ambulatory Visit: Payer: Self-pay | Admitting: Family Medicine

## 2017-06-06 DIAGNOSIS — K120 Recurrent oral aphthae: Secondary | ICD-10-CM

## 2017-07-03 ENCOUNTER — Other Ambulatory Visit: Payer: Self-pay | Admitting: Family Medicine

## 2017-07-16 ENCOUNTER — Other Ambulatory Visit: Payer: Self-pay

## 2017-07-16 ENCOUNTER — Ambulatory Visit (INDEPENDENT_AMBULATORY_CARE_PROVIDER_SITE_OTHER): Payer: Managed Care, Other (non HMO) | Admitting: Gastroenterology

## 2017-07-16 ENCOUNTER — Encounter: Payer: Self-pay | Admitting: Gastroenterology

## 2017-07-16 VITALS — BP 125/85 | HR 66 | Temp 98.0°F | Ht 66.0 in | Wt 213.4 lb

## 2017-07-16 DIAGNOSIS — R1319 Other dysphagia: Secondary | ICD-10-CM

## 2017-07-16 DIAGNOSIS — R1310 Dysphagia, unspecified: Secondary | ICD-10-CM | POA: Diagnosis not present

## 2017-07-16 NOTE — Progress Notes (Signed)
Gastroenterology Consultation  Referring Provider:     Duanne Limerick, MD Primary Care Physician:  Duanne Limerick, MD Primary Gastroenterologist:  Dr. Servando Snare     Reason for Consultation:     Dysphagia        HPI:   Cheryl Campos is a 61 y.o. y/o female referred for consultation & management of Dysphagia by Dr. Duanne Limerick, MD.  This patient comes in with a history of dysphagia.  The patient had a colonoscopy and EGD back in 2010.  The patient states that she was found to have a adenomatous polyp at that time.  The patient also reports that she has some dysphagia to both liquids and solids.  She states that is not worse with any particular food although soft foods seem to be bothersome.  There is no report of any unexplained weight loss fevers chills nausea or vomiting.  The patient also denies any black stools or bloody stools.  The patient states that her dysphagia has not been progressive.  There is also no change in bowel habits.  The patient is also concerned because her father had esophageal cancer.  There is no family history of any colon cancer or colon polyps.  Past Medical History:  Diagnosis Date  . Dystonic tremor   . GERD (gastroesophageal reflux disease)   . Hypercholesteremia   . Thyroid disease   . Tremors of nervous system     Past Surgical History:  Procedure Laterality Date  . ADENOIDECTOMY    . COLONOSCOPY  2008   [place]    Prior to Admission medications   Medication Sig Start Date End Date Taking? Authorizing Provider  atorvastatin (LIPITOR) 10 MG tablet TAKE (1) TABLET BY MOUTH EVERY DAY **NEED APPOINTMENT FOR REFILLS** 05/30/17  Yes Duanne Limerick, MD  levothyroxine (SYNTHROID, LEVOTHROID) 125 MCG tablet TAKE (1) TABLET BY MOUTH EVERY DAY 07/04/17  Yes Duanne Limerick, MD  omeprazole (PRILOSEC) 40 MG capsule Take 1 capsule (40 mg total) by mouth daily. 05/30/17  Yes Duanne Limerick, MD  propranolol (INDERAL) 80 MG tablet Take 1 tablet (80 mg total) by  mouth 2 (two) times daily. Dr Sherryll Burger 05/30/17  Yes Duanne Limerick, MD  valACYclovir (VALTREX) 500 MG tablet TAKE (1) TABLET BY MOUTH TWICE DAILY AS NEEDED FOR FLARE UP 06/07/17  Yes Duanne Limerick, MD    Family History  Problem Relation Age of Onset  . Cancer Father   . Heart disease Father   . Cancer Brother   . Diabetes Paternal Grandmother      Social History  Substance Use Topics  . Smoking status: Never Smoker  . Smokeless tobacco: Never Used  . Alcohol use No    Allergies as of 07/16/2017  . (No Known Allergies)    Review of Systems:    All systems reviewed and negative except where noted in HPI.   Physical Exam:  BP 125/85   Pulse 66   Temp 98 F (36.7 C) (Oral)   Ht  (1.676 m)   Wt 213 lb 6.4 oz (96.8 kg)   BMI 34.44 kg/m  No LMP recorded. Patient is postmenopausal. Psych:  Alert and cooperative. Normal mood and affect. General:   Alert,  Well-developed, well-nourished, pleasant and cooperative in NAD Head:  Normocephalic and atraumatic. Eyes:  Sclera clear, no icterus.   Conjunctiva pink. Ears:  Normal auditory acuity. Nose:  No deformity, discharge, or lesions. Mouth:  No deformity or  lesions,oropharynx pink & moist. Neck:  Supple; no masses or thyromegaly. There is a Pulsatile Best so above the right clavicle at the sternal notch. Lungs:  Respirations even and unlabored.  Clear throughout to auscultation.   No wheezes, crackles, or rhonchi. No acute distress. Heart:  Regular rate and rhythm; no murmurs, clicks, rubs, or gallops. Abdomen:  Normal bowel sounds.  No bruits.  Soft, non-tender and non-distended without masses, hepatosplenomegaly or hernias noted.  No guarding or rebound tenderness.  Negative Carnett sign.   Rectal:  Deferred.  Msk:  Symmetrical without gross deformities.  Good, equal movement & strength bilaterally. Pulses:  Normal pulses noted. Extremities:  No clubbing or edema.  No cyanosis. Neurologic:  Alert and oriented x3;  grossly  normal neurologically. Skin:  Intact without significant lesions or rashes.  No jaundice. Lymph Nodes:  No significant cervical adenopathy. Psych:  Alert and cooperative. Normal mood and affect.  Imaging Studies: No results found.  Assessment and Plan:   Cheryl Campos is a 61 y.o. y/o female who has a history of colon polyps and is now reporting some chronic dysphagia to both liquids and solids.  She will be set up for an EGD and colonoscopy.  I have discussed her pulsatile lesion on her neck with vascular surgery who recommended that her primary consider having the patient set up for a CT angiography of the chest and neck to rule out any aneurysms.  The patient has been explained the plan to do the EGD and colonoscopy and agrees with it.  Midge Minium, MD. Clementeen Graham   Note: This dictation was prepared with Dragon dictation along with smaller phrase technology. Any transcriptional errors that result from this process are unintentional.

## 2017-07-17 ENCOUNTER — Ambulatory Visit: Payer: No Typology Code available for payment source | Admitting: Gastroenterology

## 2017-07-18 ENCOUNTER — Encounter: Payer: Self-pay | Admitting: Family Medicine

## 2017-07-18 ENCOUNTER — Ambulatory Visit (INDEPENDENT_AMBULATORY_CARE_PROVIDER_SITE_OTHER): Payer: Managed Care, Other (non HMO) | Admitting: Family Medicine

## 2017-07-18 ENCOUNTER — Ambulatory Visit: Payer: Managed Care, Other (non HMO) | Admitting: Gastroenterology

## 2017-07-18 VITALS — BP 120/80 | HR 76 | Ht 66.0 in | Wt 213.0 lb

## 2017-07-18 DIAGNOSIS — R221 Localized swelling, mass and lump, neck: Secondary | ICD-10-CM

## 2017-07-18 DIAGNOSIS — E039 Hypothyroidism, unspecified: Secondary | ICD-10-CM

## 2017-07-18 DIAGNOSIS — Z23 Encounter for immunization: Secondary | ICD-10-CM

## 2017-07-18 NOTE — Progress Notes (Signed)
Name: Cheryl Campos   MRN: [id number]    DOB: [birth year]   Date:07/18/2017       Progress Note  Subjective  Chief Complaint  Chief Complaint  Patient presents with  . Follow-up    discuss CT  . Hypothyroidism    needs tsh recheck after being on different dose x 6 weeks    Patient referred for evauation for    Thyroid Problem  Presents for follow-up visit. Patient reports no anxiety, cold intolerance, constipation, depressed mood, diaphoresis, diarrhea, dry skin, fatigue, hair loss, heat intolerance, hoarse voice, leg swelling, menstrual problem, nail problem, palpitations, tremors, visual change, weight gain or weight loss. The symptoms have been stable.    No problem-specific Assessment & Plan notes found for this encounter.   Past Medical History:  Diagnosis Date  . Dystonic tremor   . GERD (gastroesophageal reflux disease)   . Hypercholesteremia   . Thyroid disease   . Tremors of nervous system     Past Surgical History:  Procedure Laterality Date  . ADENOIDECTOMY    . COLONOSCOPY  2008   [place]    Family History  Problem Relation Age of Onset  . Cancer Father   . Heart disease Father   . Cancer Brother   . Diabetes Paternal Grandmother     Social History   Social History  . Marital status: Married    Spouse name: N/A  . Number of children: N/A  . Years of education: N/A   Occupational History  . Not on file.   Social History Main Topics  . Smoking status: Never Smoker  . Smokeless tobacco: Never Used  . Alcohol use No  . Drug use: No  . Sexual activity: Yes   Other Topics Concern  . Not on file   Social History Narrative  . No narrative on file    No Known Allergies  Outpatient Medications Prior to Visit  Medication Sig Dispense Refill  . atorvastatin (LIPITOR) 10 MG tablet TAKE (1) TABLET BY MOUTH EVERY DAY **NEED APPOINTMENT FOR REFILLS** 90 tablet 1  . levothyroxine (SYNTHROID, LEVOTHROID) 125 MCG tablet TAKE (1) TABLET BY MOUTH  EVERY DAY 30 tablet 2  . omeprazole (PRILOSEC) 40 MG capsule Take 1 capsule (40 mg total) by mouth daily. 90 capsule 1  . propranolol (INDERAL) 80 MG tablet Take 1 tablet (80 mg total) by mouth 2 (two) times daily. Dr Sherryll Burger 180 tablet 1  . valACYclovir (VALTREX) 500 MG tablet TAKE (1) TABLET BY MOUTH TWICE DAILY AS NEEDED FOR FLARE UP 30 tablet 5   No facility-administered medications prior to visit.     Review of Systems  Constitutional: Negative for chills, diaphoresis, fatigue, fever, malaise/fatigue, weight gain and weight loss.  HENT: Negative for ear discharge, ear pain, hoarse voice and sore throat.   Eyes: Negative for blurred vision.  Respiratory: Negative for cough, sputum production, shortness of breath and wheezing.   Cardiovascular: Negative for chest pain, palpitations and leg swelling.  Gastrointestinal: Negative for abdominal pain, blood in stool, constipation, diarrhea, heartburn, melena and nausea.  Genitourinary: Negative for dysuria, frequency, hematuria, menstrual problem and urgency.  Musculoskeletal: Negative for back pain, joint pain, myalgias and neck pain.  Skin: Negative for rash.  Neurological: Negative for dizziness, tingling, tremors, sensory change, focal weakness and headaches.  Endo/Heme/Allergies: Negative for environmental allergies, cold intolerance, heat intolerance and polydipsia. Does not bruise/bleed easily.  Psychiatric/Behavioral: Negative for depression and suicidal ideas. The patient is not nervous/anxious and does  not have insomnia.      Objective  Vitals:   07/18/17 0900  BP: 120/80  Pulse: 76  Weight: 213 lb (96.6 kg)  Height:  (1.676 m)    Physical Exam  Constitutional: She is well-developed, well-nourished, and in no distress. No distress.  HENT:  Head: Normocephalic and atraumatic.  Right Ear: External ear normal.  Left Ear: External ear normal.  Nose: Nose normal.  Mouth/Throat: Oropharynx is clear and moist.  Eyes:  Pupils are equal, round, and reactive to light. Conjunctivae and EOM are normal. Right eye exhibits no discharge. Left eye exhibits no discharge.  Neck: Trachea normal and normal range of motion. Neck supple. Normal carotid pulses, no hepatojugular reflux and no JVD present. Carotid bruit is not present. No thyroid mass and no thyromegaly present.  pulsatile area above right carotid  Cardiovascular: Normal rate, regular rhythm, S1 normal, S2 normal, normal heart sounds and intact distal pulses.  PMI is not displaced.  Exam reveals no gallop and no friction rub.   No murmur heard. Pulses:      Carotid pulses are 3+ on the right side. Pulsatile area above right clavicle  Pulmonary/Chest: Effort normal and breath sounds normal. She has no wheezes. She has no rales.  Abdominal: Soft. Bowel sounds are normal. She exhibits no mass. There is no tenderness. There is no guarding.  Musculoskeletal: Normal range of motion. She exhibits no edema.  Lymphadenopathy:    She has no cervical adenopathy.  Neurological: She is alert.  Skin: Skin is warm and dry. She is not diaphoretic.  Psychiatric: Mood and affect normal.  Nursing note and vitals reviewed.     Assessment & Plan  Problem List Items Addressed This Visit      Endocrine   Thyroid activity decreased   Relevant Orders   TSH    Other Visit Diagnoses    Pulsatile neck mass    -  Primary   ?aneurysm   Relevant Orders   Ambulatory referral to Vascular Surgery   Influenza vaccine needed       Relevant Orders   Flu Vaccine QUAD 36+ mos IM (Completed)      No orders of the defined types were placed in this encounter.     Dr. Hayden [facility]  07/18/17

## 2017-07-19 LAB — TSH: TSH: 1.22 u[IU]/mL (ref 0.450–4.500)

## 2017-07-24 ENCOUNTER — Other Ambulatory Visit: Payer: Self-pay

## 2017-07-24 DIAGNOSIS — Z86010 Personal history of colonic polyps: Secondary | ICD-10-CM

## 2017-07-25 ENCOUNTER — Encounter: Payer: Self-pay | Admitting: *Deleted

## 2017-07-26 NOTE — Discharge Instructions (Signed)
General Anesthesia, Adult, Care After °These instructions provide you with information about caring for yourself after your procedure. Your health care provider may also give you more specific instructions. Your treatment has been planned according to current medical practices, but problems sometimes occur. Call your health care provider if you have any problems or questions after your procedure. °What can I expect after the procedure? °After the procedure, it is common to have: °· Vomiting. °· A sore throat. °· Mental slowness. ° °It is common to feel: °· Nauseous. °· Cold or shivery. °· Sleepy. °· Tired. °· Sore or achy, even in parts of your body where you did not have surgery. ° °Follow these instructions at home: °For at least 24 hours after the procedure: °· Do not: °? Participate in activities where you could fall or become injured. °? Drive. °? Use heavy machinery. °? Drink alcohol. °? Take sleeping pills or medicines that cause drowsiness. °? Make important decisions or sign legal documents. °? Take care of children on your own. °· Rest. °Eating and drinking °· If you vomit, drink water, juice, or soup when you can drink without vomiting. °· Drink enough fluid to keep your urine clear or pale yellow. °· Make sure you have little or no nausea before eating solid foods. °· Follow the diet recommended by your health care provider. °General instructions °· Have a responsible adult stay with you until you are awake and alert. °· Return to your normal activities as told by your health care provider. Ask your health care provider what activities are safe for you. °· Take over-the-counter and prescription medicines only as told by your health care provider. °· If you smoke, do not smoke without supervision. °· Keep all follow-up visits as told by your health care provider. This is important. °Contact a health care provider if: °· You continue to have nausea or vomiting at home, and medicines are not helpful. °· You  cannot drink fluids or start eating again. °· You cannot urinate after 8-12 hours. °· You develop a skin rash. °· You have fever. °· You have increasing redness at the site of your procedure. °Get help right away if: °· You have difficulty breathing. °· You have chest pain. °· You have unexpected bleeding. °· You feel that you are having a life-threatening or urgent problem. °This information is not intended to replace advice given to you by your health care provider. Make sure you discuss any questions you have with your health care provider. °Document Released: 01/21/2001 Document Revised: 03/19/2016 Document Reviewed: 09/29/2015 °Elsevier Interactive Patient Education © 2018 [employer] ° °

## 2017-07-29 ENCOUNTER — Ambulatory Visit
Admission: RE | Admit: 2017-07-29 | Discharge: 2017-07-29 | Disposition: A | Discharge status: Home / Self Care | Payer: Managed Care, Other (non HMO) | Source: Ambulatory Visit | Attending: Gastroenterology | Admitting: Gastroenterology

## 2017-07-29 ENCOUNTER — Encounter
Admission: RE | Disposition: A | Discharge status: Home / Self Care | Payer: Self-pay | Source: Ambulatory Visit | Attending: Gastroenterology

## 2017-07-29 ENCOUNTER — Ambulatory Visit: Payer: Managed Care, Other (non HMO) | Admitting: Anesthesiology

## 2017-07-29 DIAGNOSIS — E039 Hypothyroidism, unspecified: Secondary | ICD-10-CM | POA: Insufficient documentation

## 2017-07-29 DIAGNOSIS — Z79899 Other long term (current) drug therapy: Secondary | ICD-10-CM | POA: Insufficient documentation

## 2017-07-29 DIAGNOSIS — Z809 Family history of malignant neoplasm, unspecified: Secondary | ICD-10-CM | POA: Diagnosis not present

## 2017-07-29 DIAGNOSIS — Z860100 Personal history of colon polyps, unspecified: Secondary | ICD-10-CM

## 2017-07-29 DIAGNOSIS — G252 Other specified forms of tremor: Secondary | ICD-10-CM | POA: Diagnosis not present

## 2017-07-29 DIAGNOSIS — K2950 Unspecified chronic gastritis without bleeding: Secondary | ICD-10-CM | POA: Diagnosis not present

## 2017-07-29 DIAGNOSIS — Z833 Family history of diabetes mellitus: Secondary | ICD-10-CM | POA: Insufficient documentation

## 2017-07-29 DIAGNOSIS — R1310 Dysphagia, unspecified: Secondary | ICD-10-CM | POA: Insufficient documentation

## 2017-07-29 DIAGNOSIS — K219 Gastro-esophageal reflux disease without esophagitis: Secondary | ICD-10-CM | POA: Insufficient documentation

## 2017-07-29 DIAGNOSIS — Z8249 Family history of ischemic heart disease and other diseases of the circulatory system: Secondary | ICD-10-CM | POA: Insufficient documentation

## 2017-07-29 DIAGNOSIS — K621 Rectal polyp: Secondary | ICD-10-CM | POA: Diagnosis not present

## 2017-07-29 DIAGNOSIS — K2970 Gastritis, unspecified, without bleeding: Secondary | ICD-10-CM | POA: Diagnosis not present

## 2017-07-29 DIAGNOSIS — E7800 Pure hypercholesterolemia, unspecified: Secondary | ICD-10-CM | POA: Diagnosis not present

## 2017-07-29 DIAGNOSIS — Z1211 Encounter for screening for malignant neoplasm of colon: Secondary | ICD-10-CM | POA: Insufficient documentation

## 2017-07-29 DIAGNOSIS — K449 Diaphragmatic hernia without obstruction or gangrene: Secondary | ICD-10-CM | POA: Diagnosis not present

## 2017-07-29 DIAGNOSIS — R1319 Other dysphagia: Secondary | ICD-10-CM

## 2017-07-29 DIAGNOSIS — R1312 Dysphagia, oropharyngeal phase: Secondary | ICD-10-CM

## 2017-07-29 HISTORY — PX: COLONOSCOPY WITH PROPOFOL: SHX5780

## 2017-07-29 HISTORY — PX: POLYPECTOMY: SHX5525

## 2017-07-29 HISTORY — PX: ESOPHAGOGASTRODUODENOSCOPY (EGD) WITH PROPOFOL: SHX5813

## 2017-07-29 SURGERY — COLONOSCOPY WITH PROPOFOL
Anesthesia: General | Wound class: Contaminated

## 2017-07-29 MED ORDER — PROPOFOL 10 MG/ML IV BOLUS
INTRAVENOUS | Status: DC | PRN
  Administered 2017-07-29: 50 mg via INTRAVENOUS
  Administered 2017-07-29: 30 mg via INTRAVENOUS
  Administered 2017-07-29: 80 mg via INTRAVENOUS
  Administered 2017-07-29 (×2): 20 mg via INTRAVENOUS
  Administered 2017-07-29: 30 mg via INTRAVENOUS
  Administered 2017-07-29 (×2): 50 mg via INTRAVENOUS
  Administered 2017-07-29: 20 mg via INTRAVENOUS
  Administered 2017-07-29: 50 mg via INTRAVENOUS

## 2017-07-29 MED ORDER — SODIUM CHLORIDE 0.9 % IV SOLN: INTRAVENOUS | Status: DC

## 2017-07-29 MED ORDER — LIDOCAINE HCL (CARDIAC) 20 MG/ML IV SOLN
INTRAVENOUS | Status: DC | PRN
  Administered 2017-07-29: 40 mg via INTRAVENOUS

## 2017-07-29 MED ORDER — GLYCOPYRROLATE 0.2 MG/ML IJ SOLN
INTRAMUSCULAR | Status: DC | PRN
  Administered 2017-07-29: 0.2 mg via INTRAVENOUS

## 2017-07-29 MED ORDER — ACETAMINOPHEN 160 MG/5ML PO SOLN: 325.0000 mg | Freq: Once | ORAL | Status: DC

## 2017-07-29 MED ORDER — ACETAMINOPHEN 325 MG PO TABS: 325.0000 mg | ORAL_TABLET | Freq: Once | ORAL | Status: DC

## 2017-07-29 MED ORDER — LACTATED RINGERS IV SOLN
INTRAVENOUS | Status: DC
  Administered 2017-07-29: 08:00:00 via INTRAVENOUS

## 2017-07-29 MED ORDER — SIMETHICONE 40 MG/0.6ML PO SUSP
ORAL | Status: DC | PRN
  Administered 2017-07-29: 08:00:00

## 2017-07-29 MED ORDER — LACTATED RINGERS IV SOLN: INTRAVENOUS | Status: DC

## 2017-07-29 SURGICAL SUPPLY — 35 items
BALLN DILATOR 10-12 8 (BALLOONS)
BALLN DILATOR 12-15 8 (BALLOONS)
BALLN DILATOR 15-18 8 (BALLOONS)
BALLN DILATOR CRE 0-12 8 (BALLOONS)
BALLN DILATOR ESOPH 8 10 CRE (MISCELLANEOUS) IMPLANT
BALLOON DILATOR 12-15 8 (BALLOONS) IMPLANT
BALLOON DILATOR 15-18 8 (BALLOONS) IMPLANT
BALLOON DILATOR CRE 0-12 8 (BALLOONS) IMPLANT
BLOCK BITE 60FR ADLT L/F GRN (MISCELLANEOUS) ×4 IMPLANT
CANISTER SUCT 1200ML W/VALVE (MISCELLANEOUS) ×4 IMPLANT
CLIP HMST 235XBRD CATH ROT (MISCELLANEOUS) IMPLANT
CLIP RESOLUTION 360 11X235 (MISCELLANEOUS)
FCP ESCP3.2XJMB 240X2.8X (MISCELLANEOUS)
FORCEPS BIOP RAD 4 LRG CAP 4 (CUTTING FORCEPS) ×4 IMPLANT
FORCEPS BIOP RJ4 240 W/NDL (MISCELLANEOUS)
FORCEPS ESCP3.2XJMB 240X2.8X (MISCELLANEOUS) IMPLANT
GOWN CVR UNV OPN BCK APRN NK (MISCELLANEOUS) ×4 IMPLANT
GOWN ISOL THUMB LOOP REG UNIV (MISCELLANEOUS) ×4
INJECTOR VARIJECT VIN23 (MISCELLANEOUS) IMPLANT
KIT DEFENDO VALVE AND CONN (KITS) IMPLANT
KIT ENDO PROCEDURE OLY (KITS) ×4 IMPLANT
MARKER SPOT ENDO TATTOO 5ML (MISCELLANEOUS) IMPLANT
PAD GROUND ADULT SPLIT (MISCELLANEOUS) IMPLANT
PROBE APC STR FIRE (PROBE) IMPLANT
RETRIEVER NET PLAT FOOD (MISCELLANEOUS) IMPLANT
RETRIEVER NET ROTH 2.5X230 LF (MISCELLANEOUS) IMPLANT
SNARE SHORT THROW 13M SML OVAL (MISCELLANEOUS) IMPLANT
SNARE SHORT THROW 30M LRG OVAL (MISCELLANEOUS) IMPLANT
SNARE SNG USE RND 15MM (INSTRUMENTS) IMPLANT
SPOT EX ENDOSCOPIC TATTOO (MISCELLANEOUS)
SYR INFLATION 60ML (SYRINGE) IMPLANT
TRAP ETRAP POLY (MISCELLANEOUS) IMPLANT
VARIJECT INJECTOR VIN23 (MISCELLANEOUS)
WATER STERILE IRR 250ML POUR (IV SOLUTION) ×4 IMPLANT
WIRE CRE 18-20MM 8CM F G (MISCELLANEOUS) IMPLANT

## 2017-07-29 NOTE — Anesthesia Postprocedure Evaluation (Signed)
Anesthesia Post Note  Patient: Cheryl Campos  Procedure(s) Performed: COLONOSCOPY WITH PROPOFOL (N/A ) ESOPHAGOGASTRODUODENOSCOPY (EGD) WITH PROPOFOL (N/A ) POLYPECTOMY  Patient location during evaluation: PACU Anesthesia Type: General Level of consciousness: awake and alert and oriented Pain management: satisfactory to patient Vital Signs Assessment: post-procedure vital signs reviewed and stable Respiratory status: spontaneous breathing, nonlabored ventilation and respiratory function stable Cardiovascular status: blood pressure returned to baseline and stable Postop Assessment: Adequate PO intake and No signs of nausea or vomiting Anesthetic complications: no    [name]

## 2017-07-29 NOTE — Anesthesia Procedure Notes (Signed)
Procedure Name: MAC Date/Time: 07/29/2017 8:22 AM Performed by: Janna Arch Pre-anesthesia Checklist: Patient identified, Emergency Drugs available, Suction available and Patient being monitored Patient Re-evaluated:Patient Re-evaluated prior to induction Oxygen Delivery Method: Nasal cannula

## 2017-07-29 NOTE — Transfer of Care (Signed)
Immediate Anesthesia Transfer of Care Note  Patient: Cheryl Campos  Procedure(s) Performed: COLONOSCOPY WITH PROPOFOL (N/A ) ESOPHAGOGASTRODUODENOSCOPY (EGD) WITH PROPOFOL (N/A ) POLYPECTOMY  Patient Location: PACU  Anesthesia Type: General  Level of Consciousness: awake, alert  and patient cooperative  Airway and Oxygen Therapy: Patient Spontanous Breathing and Patient connected to supplemental oxygen  Post-op Assessment: Post-op Vital signs reviewed, Patient's Cardiovascular Status Stable, Respiratory Function Stable, Patent Airway and No signs of Nausea or vomiting  Post-op Vital Signs: Reviewed and stable  Complications: No apparent anesthesia complications

## 2017-07-29 NOTE — Op Note (Signed)
[facility] Gastroenterology Patient Name: Cheryl Campos Procedure Date: 07/29/2017 8:10 AM MRN: [id number] Account #: [id number] Date of Birth: October 10, 1956 Admit Type: Outpatient Age: 61 Room: [facility] OR ROOM 01 Gender: Female Note Status: Finalized Procedure:            Upper GI endoscopy Indications:          Dysphagia Providers:            Midge Minium MD, MD Referring MD:         Duanne Limerick, MD (Referring MD) Medicines:            Propofol per Anesthesia Complications:        No immediate complications. Procedure:            Pre-Anesthesia Assessment:                       - Prior to the procedure, a History and Physical was                        performed, and patient medications and allergies were                        reviewed. The patient's tolerance of previous                        anesthesia was also reviewed. The risks and benefits of                        the procedure and the sedation options and risks were                        discussed with the patient. All questions were                        answered, and informed consent was obtained. Prior                        Anticoagulants: The patient has taken no previous                        anticoagulant or antiplatelet agents. ASA Grade                        Assessment: II - A patient with mild systemic disease.                        After reviewing the risks and benefits, the patient was                        deemed in satisfactory condition to undergo the                        procedure.                       After obtaining informed consent, the endoscope was                        passed under direct vision. Throughout the procedure,  the patient's blood pressure, pulse, and oxygen                        saturations were monitored continuously. The Olympus                        [id number]-HQ190 Endoscope (S#. [id number]) was introduced                        through the  mouth, and advanced to the second part of                        duodenum. The upper GI endoscopy was accomplished                        without difficulty. The patient tolerated the procedure                        well. Findings:      A small hiatal hernia was present. Two biopsies were obtained in the       middle third of the esophagus with cold forceps for histology.      Localized moderate inflammation characterized by erythema was found in       the gastric body. Biopsies were taken with a cold forceps for histology.      The examined duodenum was normal. Impression:           - Small hiatal hernia.                       - Gastritis. Biopsied.                       - Normal examined duodenum.                       - Two biopsies were obtained in the middle third of the                        esophagus. Recommendation:       - Discharge patient to home.                       - Resume previous diet.                       - Continue present medications.                       - Await pathology results. Procedure Code(s):    --- Professional ---                       [phone], Esophagogastroduodenoscopy, flexible, transoral;                        with biopsy, single or multiple Diagnosis Code(s):    --- Professional ---                       R13.10, Dysphagia, unspecified                       K29.70, Gastritis, unspecified, without bleeding CPT copyright 2016 American Medical Association.  All rights reserved. The codes documented in this report are preliminary and upon coder review may  be revised to meet current compliance requirements. Midge Minium MD, MD 07/29/2017 8:28:08 AM This report has been signed electronically. Number of Addenda: 0 Note Initiated On: 07/29/2017 8:10 AM      [facility]

## 2017-07-29 NOTE — Anesthesia Preprocedure Evaluation (Signed)
Anesthesia Evaluation  Patient identified by MRN, date of birth, ID band Patient awake    Reviewed: Allergy & Precautions, H&P , NPO status , Patient's Chart, lab work & pertinent test results  Airway Mallampati: II  TM Distance: >3 FB Neck ROM: full    Dental no notable dental hx.    Pulmonary    Pulmonary exam normal breath sounds clear to auscultation       Cardiovascular Normal cardiovascular exam Rhythm:regular Rate:Normal     Neuro/Psych Tremmor    GI/Hepatic GERD  ,  Endo/Other  Hypothyroidism   Renal/GU      Musculoskeletal   Abdominal   Peds  Hematology   Anesthesia Other Findings   Reproductive/Obstetrics                             Anesthesia Physical Anesthesia Plan  ASA: II  Anesthesia Plan: General   Post-op Pain Management:    Induction:   PONV Risk Score and Plan: 3 and Propofol infusion  Airway Management Planned:   Additional Equipment:   Intra-op Plan:   Post-operative Plan:   Informed Consent: I have reviewed the patients History and Physical, chart, labs and discussed the procedure including the risks, benefits and alternatives for the proposed anesthesia with the patient or authorized representative who has indicated his/her understanding and acceptance.     Plan Discussed with: CRNA  Anesthesia Plan Comments:         Anesthesia Quick Evaluation

## 2017-07-29 NOTE — H&P (Signed)
   Cheryl Minium, MD [facility] [address], [address] [place], [place] [address] Phone:[phone] Fax : [phone]  Primary Care Physician:  Duanne Limerick, MD Primary Gastroenterologist:  Dr. Servando Snare  Pre-Procedure History & Physical: HPI:  LEEANDRA ELLERSON is a 61 y.o. female is here for an endoscopy and colonoscopy.   Past Medical History:  Diagnosis Date  . Dystonic tremor    head, neck  . GERD (gastroesophageal reflux disease)   . Hypercholesteremia   . Thyroid disease   . Tremors of nervous system     Past Surgical History:  Procedure Laterality Date  . ADENOIDECTOMY    . COLONOSCOPY  2008   [place]    Prior to Admission medications   Medication Sig Start Date End Date Taking? Authorizing Provider  atorvastatin (LIPITOR) 10 MG tablet TAKE (1) TABLET BY MOUTH EVERY DAY **NEED APPOINTMENT FOR REFILLS** 05/30/17  Yes Duanne Limerick, MD  levothyroxine (SYNTHROID, LEVOTHROID) 125 MCG tablet TAKE (1) TABLET BY MOUTH EVERY DAY 07/04/17  Yes Duanne Limerick, MD  omeprazole (PRILOSEC) 40 MG capsule Take 1 capsule (40 mg total) by mouth daily. 05/30/17  Yes Duanne Limerick, MD  propranolol (INDERAL) 80 MG tablet Take 1 tablet (80 mg total) by mouth 2 (two) times daily. Dr Sherryll Burger 05/30/17  Yes Duanne Limerick, MD  valACYclovir (VALTREX) 500 MG tablet TAKE (1) TABLET BY MOUTH TWICE DAILY AS NEEDED FOR FLARE UP 06/07/17   Duanne Limerick, MD    Allergies as of 07/24/2017  . (No Known Allergies)    Family History  Problem Relation Age of Onset  . Cancer Father   . Heart disease Father   . Cancer Brother   . Diabetes Paternal Grandmother     Social History   Social History  . Marital status: Married    Spouse name: N/A  . Number of children: N/A  . Years of education: N/A   Occupational History  . Not on file.   Social History Main Topics  . Smoking status: Never Smoker  . Smokeless tobacco: Never Used  . Alcohol use No  . Drug use: No  . Sexual activity: Yes   Other Topics  Concern  . Not on file   Social History Narrative  . No narrative on file    Review of Systems: See HPI, otherwise negative ROS  Physical Exam: BP 127/61   Pulse 75   Temp 97.7 F (36.5 C) (Tympanic)   Resp 16   Ht  (1.676 m)   Wt 209 lb (94.8 kg)   SpO2 97%   BMI 33.73 kg/m  General:   Alert,  pleasant and cooperative in NAD Head:  Normocephalic and atraumatic. Neck:  Supple; no masses or thyromegaly. Lungs:  Clear throughout to auscultation.    Heart:  Regular rate and rhythm. Abdomen:  Soft, nontender and nondistended. Normal bowel sounds, without guarding, and without rebound.   Neurologic:  Alert and  oriented x4;  grossly normal neurologically.  Impression/Plan: SKAI LICKTEIG is here for an endoscopy and colonoscopy to be performed for dysphagia and history of cooln polyps  Risks, benefits, limitations, and alternatives regarding  endoscopy and colonoscopy have been reviewed with the patient.  Questions have been answered.  All parties agreeable.   Cheryl Minium, MD  07/29/2017, 7:44 AM

## 2017-07-29 NOTE — Op Note (Signed)
[facility] Gastroenterology Patient Name: Cheryl Campos Procedure Date: 07/29/2017 8:07 AM MRN: [id number] Account #: [id number] Date of Birth: 1956/03/24 Admit Type: Outpatient Age: 61 Room: [facility] OR ROOM 01 Gender: Female Note Status: Finalized Procedure:            Colonoscopy Indications:          High risk colon cancer surveillance: Personal history                        of colonic polyps Providers:            Midge Minium MD, MD Medicines:            Propofol per Anesthesia Complications:        No immediate complications. Procedure:            Pre-Anesthesia Assessment:                       - Prior to the procedure, a History and Physical was                        performed, and patient medications and allergies were                        reviewed. The patient's tolerance of previous                        anesthesia was also reviewed. The risks and benefits of                        the procedure and the sedation options and risks were                        discussed with the patient. All questions were                        answered, and informed consent was obtained. Prior                        Anticoagulants: The patient has taken no previous                        anticoagulant or antiplatelet agents. ASA Grade                        Assessment: II - A patient with mild systemic disease.                        After reviewing the risks and benefits, the patient was                        deemed in satisfactory condition to undergo the                        procedure.                       After obtaining informed consent, the colonoscope was                        passed under direct vision. Throughout the  procedure,                        the patient's blood pressure, pulse, and oxygen                        saturations were monitored continuously. The Olympus                        190 Colonoscope [id number]) was introduced through the                   anus and advanced to the the cecum, identified by                        appendiceal orifice and ileocecal valve. The                        colonoscopy was performed without difficulty. The                        patient tolerated the procedure well. The quality of                        the bowel preparation was excellent. Findings:      The perianal and digital rectal examinations were normal.      Two sessile polyps were found in the rectum. The polyps were 2 to 3 mm       in size. These polyps were removed with a cold biopsy forceps. Resection       and retrieval were complete. Impression:           - Two 2 to 3 mm polyps in the rectum, removed with a                        cold biopsy forceps. Resected and retrieved. Recommendation:       - Discharge patient to home.                       - Resume previous diet.                       - Continue present medications.                       - Await pathology results.                       - Repeat colonoscopy in 5 years for surveillance. Procedure Code(s):    --- Professional ---                       [phone], Colonoscopy, flexible; with biopsy, single or                        multiple Diagnosis Code(s):    --- Professional ---                       Z86.010, Personal history of colonic polyps                       K62.1, Rectal polyp CPT copyright 2016 American Medical Association. All rights reserved.  The codes documented in this report are preliminary and upon coder review may  be revised to meet current compliance requirements. Midge Minium MD, MD 07/29/2017 8:43:23 AM This report has been signed electronically. Number of Addenda: 0 Note Initiated On: 07/29/2017 8:07 AM Scope Withdrawal Time: 0 hours 7 minutes 9 seconds  Total Procedure Duration: 0 hours 12 minutes 5 seconds       [facility]

## 2017-07-30 ENCOUNTER — Encounter: Payer: Self-pay | Admitting: Gastroenterology

## 2017-08-26 ENCOUNTER — Ambulatory Visit (INDEPENDENT_AMBULATORY_CARE_PROVIDER_SITE_OTHER): Payer: Managed Care, Other (non HMO) | Admitting: Vascular Surgery

## 2017-08-26 ENCOUNTER — Encounter (INDEPENDENT_AMBULATORY_CARE_PROVIDER_SITE_OTHER): Payer: Self-pay | Admitting: Vascular Surgery

## 2017-08-26 VITALS — BP 139/85 | HR 59 | Resp 16 | Ht 65.0 in | Wt 210.4 lb

## 2017-08-26 DIAGNOSIS — E782 Mixed hyperlipidemia: Secondary | ICD-10-CM

## 2017-08-26 DIAGNOSIS — R0989 Other specified symptoms and signs involving the circulatory and respiratory systems: Secondary | ICD-10-CM

## 2017-08-27 DIAGNOSIS — R0989 Other specified symptoms and signs involving the circulatory and respiratory systems: Secondary | ICD-10-CM | POA: Insufficient documentation

## 2017-08-27 NOTE — Progress Notes (Signed)
MRN : [id number]  Cheryl Campos is a 61 y.o. (05-09-1956) female who presents with chief complaint of  Chief Complaint  Patient presents with  . New Patient (Initial Visit)    pulsatile neck mass  .  History of Present Illness:The patient is seen for evaluation of carotid pulsation possible aneurysm. The carotid pulsation was identified after the patient noted the pulsating mass in the mirror.  No history of trauma or injury.  The mass is nontender.  The patient denies amaurosis fugax. There is no recent history of TIA symptoms or focal motor deficits. There is no prior documented CVA.  There is no history of migraine headaches. There is no history of seizures.  The patient is taking enteric-coated aspirin 81 mg daily.  The patient has a history of coronary artery disease, no recent episodes of angina or shortness of breath. The patient denies PAD or claudication symptoms. There is a history of hyperlipidemia which is being treated with a statin.   Current Meds  Medication Sig  . atorvastatin (LIPITOR) 10 MG tablet TAKE (1) TABLET BY MOUTH EVERY DAY **NEED APPOINTMENT FOR REFILLS**  . levothyroxine (SYNTHROID, LEVOTHROID) 125 MCG tablet TAKE (1) TABLET BY MOUTH EVERY DAY  . omeprazole (PRILOSEC) 40 MG capsule Take 1 capsule (40 mg total) by mouth daily.  . propranolol (INDERAL) 80 MG tablet Take 1 tablet (80 mg total) by mouth 2 (two) times daily. Dr Sherryll Burger  . valACYclovir (VALTREX) 500 MG tablet TAKE (1) TABLET BY MOUTH TWICE DAILY AS NEEDED FOR FLARE UP    Past Medical History:  Diagnosis Date  . Dystonic tremor    head, neck  . GERD (gastroesophageal reflux disease)   . Hypercholesteremia   . Thyroid disease   . Tremors of nervous system     Past Surgical History:  Procedure Laterality Date  . ADENOIDECTOMY    . COLONOSCOPY  2008   [place]  . COLONOSCOPY WITH PROPOFOL N/A 07/29/2017   Procedure: COLONOSCOPY WITH PROPOFOL;  Surgeon: Midge Minium, MD;  Location:  [facility];  Service: Gastroenterology;  Laterality: N/A;  . ESOPHAGOGASTRODUODENOSCOPY (EGD) WITH PROPOFOL N/A 07/29/2017   Procedure: ESOPHAGOGASTRODUODENOSCOPY (EGD) WITH PROPOFOL;  Surgeon: Midge Minium, MD;  Location: [facility];  Service: Gastroenterology;  Laterality: N/A;  requests early  . POLYPECTOMY  07/29/2017   Procedure: POLYPECTOMY;  Surgeon: Midge Minium, MD;  Location: [facility];  Service: Gastroenterology;;    Social History Social History  Substance Use Topics  . Smoking status: Never Smoker  . Smokeless tobacco: Never Used  . Alcohol use No    Family History Family History  Problem Relation Age of Onset  . Cancer Father   . Heart disease Father   . Cancer Brother   . Diabetes Paternal Grandmother   No family history of bleeding/clotting disorders, porphyria or autoimmune disease   No Known Allergies   REVIEW OF SYSTEMS (Negative unless checked)  Constitutional: [] Weight loss  [] Fever  [] Chills Cardiac: [] Chest pain   [] Chest pressure   [] Palpitations   [] Shortness of breath when laying flat   [] Shortness of breath with exertion. Vascular:  [] Pain in legs with walking   [] Pain in legs at rest  [] History of DVT   [] Phlebitis   [] Swelling in legs   [] Varicose veins   [] Non-healing ulcers Pulmonary:   [] Uses home oxygen   [] Productive cough   [] Hemoptysis   [] Wheeze  [] COPD   [] Asthma Neurologic:  [] Dizziness   [] Seizures   []   History of stroke   [] History of TIA  [] Aphasia   [] Vissual changes   [] Weakness or numbness in arm   [] Weakness or numbness in leg Musculoskeletal:   [] Joint swelling   [] Joint pain   [] Low back pain Hematologic:  [] Easy bruising  [] Easy bleeding   [] Hypercoagulable state   [] Anemic Gastrointestinal:  [] Diarrhea   [] Vomiting  [] Gastroesophageal reflux/heartburn   [] Difficulty swallowing. Genitourinary:  [] Chronic kidney disease   [] Difficult urination  [] Frequent urination   [] Blood in urine Skin:  [] Rashes    [] Ulcers  Psychological:  [] History of anxiety   []  History of major depression.  Physical Examination  Vitals:   08/26/17 1016  BP: 139/85  Pulse: (!) 59  Resp: 16  Weight: 95.4 kg (210 lb 6.4 oz)  Height: 5\' 5"  (1.651 m)   Body mass index is 35.01 kg/m. Gen: WD/WN, NAD Head: [place]/AT, No temporalis wasting.  Ear/Nose/Throat: Hearing grossly intact, nares w/o erythema or drainage, poor dentition Eyes: PER, EOMI, sclera nonicteric.  Neck: Supple, no masses.  No bruit or JVD.  Pulmonary:  Good air movement, clear to auscultation bilaterally, no use of accessory muscles.  Cardiac: RRR, normal S1, S2, no Murmurs. Vascular:  1-2 cm mass at the base of the right neck nontender no skin changes + pulsitile Vessel Right Left  Radial Palpable Palpable  Ulnar Palpable Palpable  Brachial Palpable Palpable  Carotid Palpable Palpable  Gastrointestinal: soft, non-distended. No guarding/no peritoneal signs.  Musculoskeletal: M/S 5/5 throughout.  No deformity or atrophy.  Neurologic: CN 2-12 intact. Pain and light touch intact in extremities.  Symmetrical.  Speech is fluent. Motor exam as listed above. Psychiatric: Judgment intact, Mood & affect appropriate for pt's clinical situation. Dermatologic: No rashes or ulcers noted.  No changes consistent with cellulitis. Lymph : No Cervical lymphadenopathy, no lichenification or skin changes of chronic lymphedema.  CBC Lab Results  Component Value Date   WBC 12.4 (H) 03/14/2017   HGB 13.5 03/14/2017   HCT 41.8 03/14/2017   MCV 88.2 03/14/2017   PLT 232 03/14/2017    BMET    Component Value Date/Time   NA 142 05/30/2017 0923   K 4.1 05/30/2017 0923   CL 103 05/30/2017 0923   CO2 24 05/30/2017 0923   GLUCOSE 95 05/30/2017 0923   GLUCOSE 109 (H) 03/14/2017 0003   BUN 10 05/30/2017 0923   CREATININE 0.70 05/30/2017 0923   CALCIUM 9.1 05/30/2017 0923   GFRNONAA 95 05/30/2017 0923   GFRAA 110 05/30/2017 0923   CrCl cannot be calculated  (Patient's most recent lab result is older than the maximum 21 days allowed.).  COAG No results found for: INR, PROTIME  Radiology No results found.  Assessment/Plan 1. Abnormal carotid pulse Recommend:  Given the patient's asymptomatic subcritical stenosis no further invasive testing or surgery at this time.  Duplex ultrasound shows minimal carotid stenosis bilaterally.  It is consistent with carotid tortuosity with mild enlargement of the subclavian and common carotid but not to the degree that it would be considered an aneurysm  Continue antiplatelet therapy as prescribed Continue management of CAD, HTN and Hyperlipidemia Healthy heart diet,  encouraged exercise at least 4 times per week Follow up in 12 months with duplex ultrasound and physical exam   2. Mixed hyperlipidemia Continue statin as ordered and reviewed, no changes at this time     Levora DredgeGregory Schnier, MD  08/27/2017 10:14 AM

## 2017-10-25 ENCOUNTER — Other Ambulatory Visit: Payer: Self-pay | Admitting: Family Medicine

## 2017-11-11 ENCOUNTER — Other Ambulatory Visit: Payer: Self-pay | Admitting: Family Medicine

## 2017-11-11 DIAGNOSIS — E782 Mixed hyperlipidemia: Secondary | ICD-10-CM

## 2018-01-27 ENCOUNTER — Other Ambulatory Visit: Payer: Self-pay | Admitting: Family Medicine

## 2018-02-12 ENCOUNTER — Other Ambulatory Visit: Payer: Self-pay | Admitting: Family Medicine

## 2018-02-12 DIAGNOSIS — E782 Mixed hyperlipidemia: Secondary | ICD-10-CM

## 2018-02-27 ENCOUNTER — Other Ambulatory Visit: Payer: Self-pay | Admitting: Family Medicine

## 2018-03-17 ENCOUNTER — Encounter: Payer: Self-pay | Admitting: Family Medicine

## 2018-03-17 ENCOUNTER — Ambulatory Visit (INDEPENDENT_AMBULATORY_CARE_PROVIDER_SITE_OTHER): Payer: Managed Care, Other (non HMO) | Admitting: Family Medicine

## 2018-03-17 VITALS — BP 120/80 | HR 64 | Ht 65.0 in | Wt 210.0 lb

## 2018-03-17 DIAGNOSIS — Z1159 Encounter for screening for other viral diseases: Secondary | ICD-10-CM | POA: Diagnosis not present

## 2018-03-17 DIAGNOSIS — K219 Gastro-esophageal reflux disease without esophagitis: Secondary | ICD-10-CM | POA: Diagnosis not present

## 2018-03-17 DIAGNOSIS — E039 Hypothyroidism, unspecified: Secondary | ICD-10-CM | POA: Diagnosis not present

## 2018-03-17 DIAGNOSIS — R251 Tremor, unspecified: Secondary | ICD-10-CM

## 2018-03-17 DIAGNOSIS — K120 Recurrent oral aphthae: Secondary | ICD-10-CM | POA: Insufficient documentation

## 2018-03-17 DIAGNOSIS — E782 Mixed hyperlipidemia: Secondary | ICD-10-CM | POA: Diagnosis not present

## 2018-03-17 DIAGNOSIS — Z23 Encounter for immunization: Secondary | ICD-10-CM | POA: Diagnosis not present

## 2018-03-17 MED ORDER — LEVOTHYROXINE SODIUM 125 MCG PO TABS: ORAL_TABLET | ORAL | 1 refills | Status: DC

## 2018-03-17 MED ORDER — VALACYCLOVIR HCL 500 MG PO TABS: ORAL_TABLET | ORAL | 5 refills | Status: DC

## 2018-03-17 MED ORDER — OMEPRAZOLE 40 MG PO CPDR: 40.0000 mg | DELAYED_RELEASE_CAPSULE | Freq: Every day | ORAL | 1 refills | Status: DC

## 2018-03-17 MED ORDER — PROPRANOLOL HCL 80 MG PO TABS: 80.0000 mg | ORAL_TABLET | Freq: Two times a day (BID) | ORAL | 1 refills | Status: DC

## 2018-03-17 MED ORDER — ATORVASTATIN CALCIUM 10 MG PO TABS: 10.0000 mg | ORAL_TABLET | Freq: Every day | ORAL | 1 refills | Status: DC

## 2018-03-17 NOTE — Progress Notes (Signed)
Name: Cheryl Campos   MRN: [id number]    DOB: 07/03/1956   Date:03/17/2018       Progress Note  Subjective  Chief Complaint  Chief Complaint  Patient presents with  . Gastroesophageal Reflux  . Hyperlipidemia  . Tremors  . Hypothyroidism    Gastroesophageal Reflux  She complains of choking and dysphagia. She reports no abdominal pain, no belching, no chest pain, no coughing, no early satiety, no globus sensation, no heartburn, no hoarse voice, no nausea, no sore throat, no stridor, no tooth decay, no water brash or no wheezing. endoscopy. This is a new problem. The current episode started more than 1 year ago. The problem occurs frequently. The problem has been gradually improving. The symptoms are aggravated by certain foods. Pertinent negatives include no anemia, fatigue, melena, muscle weakness, orthopnea or weight loss. There are no known risk factors. She has tried a PPI for the symptoms. The treatment provided moderate relief. Past procedures do not include an abdominal ultrasound, an EGD, esophageal manometry, esophageal pH monitoring, H. pylori antibody titer or a UGI.  Hyperlipidemia  This is a new problem. The current episode started more than 1 year ago. The problem is controlled. Recent lipid tests were reviewed and are normal. She has no history of chronic renal disease, diabetes, hypothyroidism, liver disease, obesity or nephrotic syndrome. There are no known factors aggravating her hyperlipidemia. Pertinent negatives include no chest pain, focal sensory loss, focal weakness, myalgias or shortness of breath. Current antihyperlipidemic treatment includes statins. The current treatment provides moderate improvement of lipids. There are no compliance problems.  Risk factors for coronary artery disease include dyslipidemia.  Neurologic Problem  The patient's pertinent negatives include no altered mental status, clumsiness, focal sensory loss, focal weakness, loss of balance, memory  loss, near-syncope, slurred speech, syncope, visual change or weakness. Primary symptoms comment: endoscopy. This is a chronic problem. The current episode started more than 1 year ago. The problem has been gradually improving since onset. Pertinent negatives include no abdominal pain, auditory change, back pain, bladder incontinence, bowel incontinence, chest pain, confusion, diaphoresis, dizziness, fatigue, fever, headaches, nausea, neck pain, palpitations or shortness of breath. The treatment provided moderate relief. There is no history of a bleeding disorder, a clotting disorder, a CVA, dementia, head trauma, liver disease, mood changes or seizures.    No problem-specific Assessment & Plan notes found for this encounter.   Past Medical History:  Diagnosis Date  . Dystonic tremor    head, neck  . GERD (gastroesophageal reflux disease)   . Hypercholesteremia   . Thyroid disease   . Tremors of nervous system     Past Surgical History:  Procedure Laterality Date  . ADENOIDECTOMY    . COLONOSCOPY  2008   [place]  . COLONOSCOPY WITH PROPOFOL N/A 07/29/2017   Procedure: COLONOSCOPY WITH PROPOFOL;  Surgeon: Midge Minium, MD;  Location: [facility];  Service: Gastroenterology;  Laterality: N/A;  . ESOPHAGOGASTRODUODENOSCOPY (EGD) WITH PROPOFOL N/A 07/29/2017   Procedure: ESOPHAGOGASTRODUODENOSCOPY (EGD) WITH PROPOFOL;  Surgeon: Midge Minium, MD;  Location: [facility];  Service: Gastroenterology;  Laterality: N/A;  requests early  . POLYPECTOMY  07/29/2017   Procedure: POLYPECTOMY;  Surgeon: Midge Minium, MD;  Location: [facility];  Service: Gastroenterology;;    Family History  Problem Relation Age of Onset  . Cancer Father   . Heart disease Father   . Cancer Brother   . Diabetes Paternal Grandmother     Social History   Socioeconomic  History  . Marital status: Married    Spouse name: Not on file  . Number of children: Not on file  . Years of education:  Not on file  . Highest education level: Not on file  Occupational History  . Not on file  Social Needs  . Financial resource strain: Not on file  . Food insecurity:    Worry: Not on file    Inability: Not on file  . Transportation needs:    Medical: Not on file    Non-medical: Not on file  Tobacco Use  . Smoking status: Never Smoker  . Smokeless tobacco: Never Used  Substance and Sexual Activity  . Alcohol use: No  . Drug use: No  . Sexual activity: Yes  Lifestyle  . Physical activity:    Days per week: Not on file    Minutes per session: Not on file  . Stress: Not on file  Relationships  . Social connections:    Talks on phone: Not on file    Gets together: Not on file    Attends religious service: Not on file    Active member of club or organization: Not on file    Attends meetings of clubs or organizations: Not on file    Relationship status: Not on file  . Intimate partner violence:    Fear of current or ex partner: Not on file    Emotionally abused: Not on file    Physically abused: Not on file    Forced sexual activity: Not on file  Other Topics Concern  . Not on file  Social History Narrative  . Not on file    No Known Allergies  Outpatient Medications Prior to Visit  Medication Sig Dispense Refill  . atorvastatin (LIPITOR) 10 MG tablet TAKE ONE TABLET AT BEDTIME. 90 tablet 0  . levothyroxine (SYNTHROID, LEVOTHROID) 125 MCG tablet TAKE 1 TABLET ON AN EMPTY STOMACH WITH A GLASS OF WATER AT LEAST 30 TO 60 MINUTES BEFORE BREAKFAST. 30 tablet 0  . omeprazole (PRILOSEC) 40 MG capsule Take 1 capsule (40 mg total) by mouth daily. 90 capsule 1  . propranolol (INDERAL) 80 MG tablet Take 1 tablet (80 mg total) by mouth 2 (two) times daily. Dr Sherryll Burger 180 tablet 1  . valACYclovir (VALTREX) 500 MG tablet TAKE (1) TABLET BY MOUTH TWICE DAILY AS NEEDED FOR FLARE UP 30 tablet 5   No facility-administered medications prior to visit.     Review of Systems  Constitutional:  Negative for chills, diaphoresis, fatigue, fever, malaise/fatigue and weight loss.  HENT: Negative for ear discharge, ear pain, hoarse voice and sore throat.   Eyes: Negative for blurred vision.  Respiratory: Positive for choking. Negative for cough, sputum production, shortness of breath and wheezing.   Cardiovascular: Negative for chest pain, palpitations, leg swelling and near-syncope.  Gastrointestinal: Positive for dysphagia. Negative for abdominal pain, blood in stool, bowel incontinence, constipation, diarrhea, heartburn, melena and nausea.  Genitourinary: Negative for bladder incontinence, dysuria, frequency, hematuria and urgency.  Musculoskeletal: Negative for back pain, joint pain, myalgias, muscle weakness and neck pain.  Skin: Negative for rash.  Neurological: Negative for dizziness, tingling, sensory change, focal weakness, syncope, weakness, headaches and loss of balance.  Endo/Heme/Allergies: Negative for environmental allergies and polydipsia. Does not bruise/bleed easily.  Psychiatric/Behavioral: Negative for confusion, depression, memory loss and suicidal ideas. The patient is not nervous/anxious and does not have insomnia.      Objective  Vitals:   03/17/18 0923  BP:  120/80  Pulse: 64  Weight: 210 lb (95.3 kg)  Height:  (1.651 m)    Physical Exam  Constitutional: She is oriented to person, place, and time. She appears well-developed and well-nourished.  HENT:  Head: Normocephalic.  Right Ear: Hearing, tympanic membrane, external ear and ear canal normal.  Left Ear: Hearing, tympanic membrane, external ear and ear canal normal.  Nose: Nose normal.  Mouth/Throat: Uvula is midline and oropharynx is clear and moist. No oropharyngeal exudate, posterior oropharyngeal edema or posterior oropharyngeal erythema.  Eyes: Pupils are equal, round, and reactive to light. Conjunctivae and EOM are normal. Lids are everted and swept, no foreign bodies found. Left eye exhibits  no hordeolum. No foreign body present in the left eye. Right conjunctiva is not injected. Left conjunctiva is not injected. No scleral icterus.  Neck: Normal range of motion. Neck supple. No JVD present. No tracheal deviation present. No thyromegaly present.  Cardiovascular: Normal rate, regular rhythm, normal heart sounds and intact distal pulses. Exam reveals no gallop and no friction rub.  No murmur heard. Pulmonary/Chest: Effort normal and breath sounds normal. No respiratory distress. She has no wheezes. She has no rales.  Abdominal: Soft. Bowel sounds are normal. She exhibits no mass. There is no hepatosplenomegaly. There is no tenderness. There is no rebound and no guarding.  Musculoskeletal: Normal range of motion. She exhibits no edema or tenderness.  Lymphadenopathy:    She has no cervical adenopathy.  Neurological: She is alert and oriented to person, place, and time. She has normal strength. She displays normal reflexes. No cranial nerve deficit.  Skin: Skin is warm. No rash noted.  Psychiatric: She has a normal mood and affect. Her mood appears not anxious. She does not exhibit a depressed mood.  Nursing note and vitals reviewed.     Assessment & Plan  Problem List Items Addressed This Visit      Digestive   GERD (gastroesophageal reflux disease)   Relevant Medications   omeprazole (PRILOSEC) 40 MG capsule   Stomatitis herpetiformis   Relevant Medications   valACYclovir (VALTREX) 500 MG tablet     Endocrine   Thyroid activity decreased - Primary   Relevant Medications   propranolol (INDERAL) 80 MG tablet   levothyroxine (SYNTHROID, LEVOTHROID) 125 MCG tablet   Other Relevant Orders   TSH     Other   Hyperlipidemia   Relevant Medications   atorvastatin (LIPITOR) 10 MG tablet   propranolol (INDERAL) 80 MG tablet   Other Relevant Orders   Lipid panel   Tremor   Relevant Medications   propranolol (INDERAL) 80 MG tablet    Other Visit Diagnoses    Need for  hepatitis C screening test       Relevant Orders   Hepatitis C antibody   Need for Tdap vaccination       Relevant Orders   Tdap vaccine greater than or equal to 7yo IM (Completed)      Meds ordered this encounter  Medications  . omeprazole (PRILOSEC) 40 MG capsule    Sig: Take 1 capsule (40 mg total) by mouth daily.    Dispense:  90 capsule    Refill:  1  . atorvastatin (LIPITOR) 10 MG tablet    Sig: Take 1 tablet (10 mg total) by mouth at bedtime.    Dispense:  90 tablet    Refill:  1  . valACYclovir (VALTREX) 500 MG tablet    Sig: TAKE (1) TABLET BY MOUTH TWICE DAILY  AS NEEDED FOR FLARE UP    Dispense:  30 tablet    Refill:  5  . DISCONTD: levothyroxine (SYNTHROID, LEVOTHROID) 125 MCG tablet    Sig: TAKE 1 TABLET ON AN EMPTY STOMACH WITH A GLASS OF WATER AT LEAST 30 TO 60 MINUTES BEFORE BREAKFAST.    Dispense:  90 tablet    Refill:  1    Last time filling  . propranolol (INDERAL) 80 MG tablet    Sig: Take 1 tablet (80 mg total) by mouth 2 (two) times daily. Dr Sherryll Burger    Dispense:  180 tablet    Refill:  1  . levothyroxine (SYNTHROID, LEVOTHROID) 125 MCG tablet    Sig: TAKE 1 TABLET ON AN EMPTY STOMACH WITH A GLASS OF WATER AT LEAST 30 TO 60 MINUTES BEFORE BREAKFAST.    Dispense:  90 tablet    Refill:  1    Last time filling      Dr. Elizabeth Sauer [facility]  03/17/18

## 2018-03-18 LAB — LIPID PANEL
Chol/HDL Ratio: 3.5 ratio (ref 0.0–4.4)
Cholesterol, Total: 170 mg/dL (ref 100–199)
HDL: 49 mg/dL (ref >39)
LDL Calculated: 92 mg/dL (ref 0–99)
Triglycerides: 145 mg/dL (ref 0–149)
VLDL Cholesterol Cal: 29 mg/dL (ref 5–40)

## 2018-03-18 LAB — TSH: TSH: 1.2 u[IU]/mL (ref 0.450–4.500)

## 2018-03-18 LAB — HEPATITIS C ANTIBODY

## 2018-06-24 IMAGING — US US CAROTID DUPLEX BILAT
1 series · 14 of 24 positions shown · non-contrast
Comparison: None.

CLINICAL DATA: Abnormal carotid pulse

EXAM:
BILATERAL CAROTID DUPLEX ULTRASOUND
TECHNIQUE: Gray scale imaging, color Doppler and duplex ultrasound were
performed of bilateral carotid and vertebral arteries in the neck.

[Series 1: us carotid duplex bilat · 0.06mm/px · 14 of 68 slices shown]
[im 1/68]
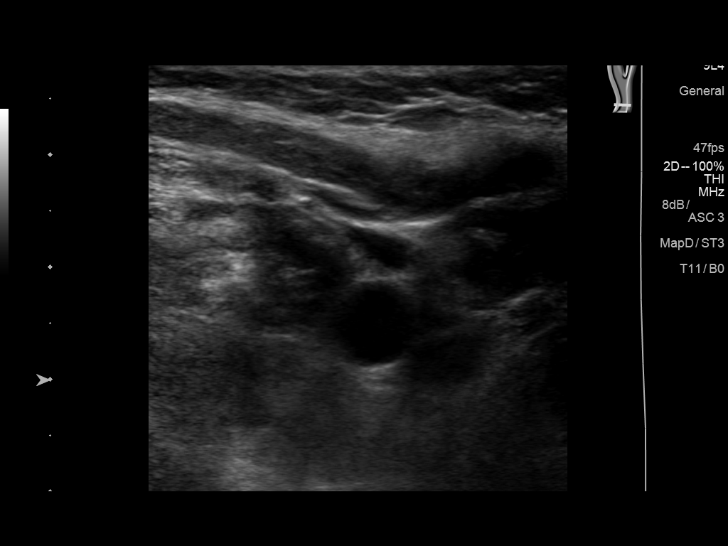
[im 6/68]
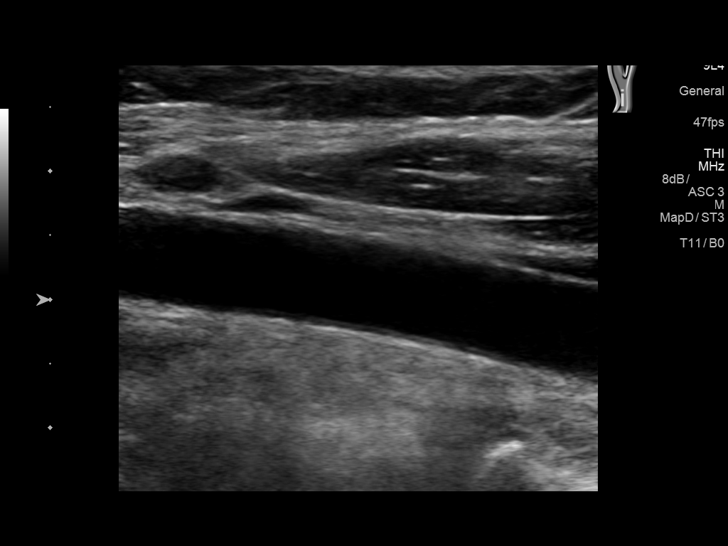
[im 12/68]
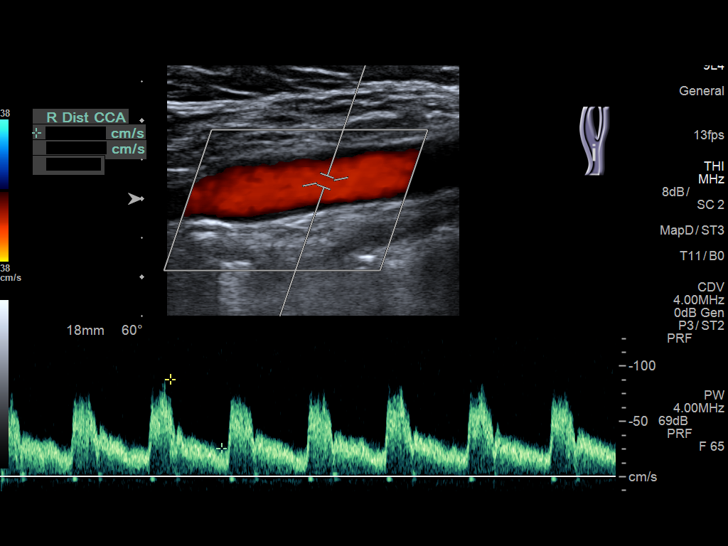
[im 18/68]
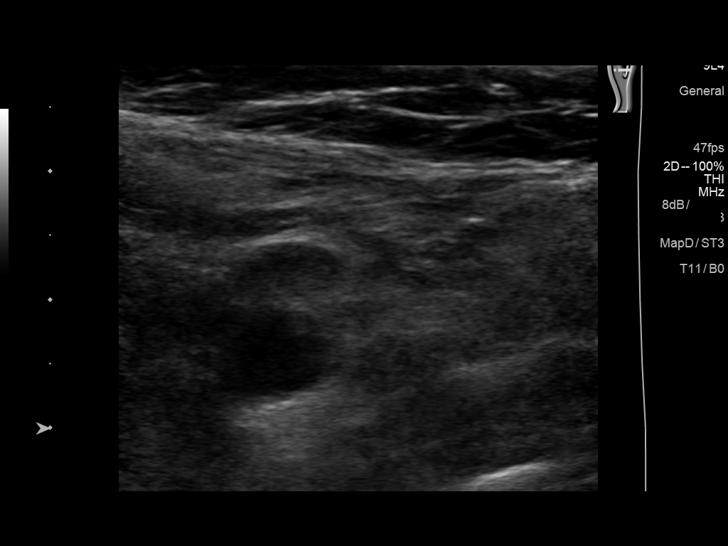
[im 21/68]
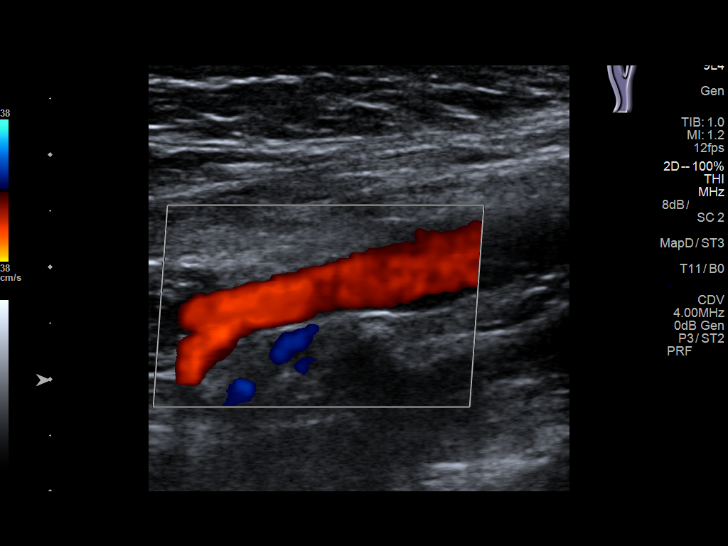
[im 27/68]
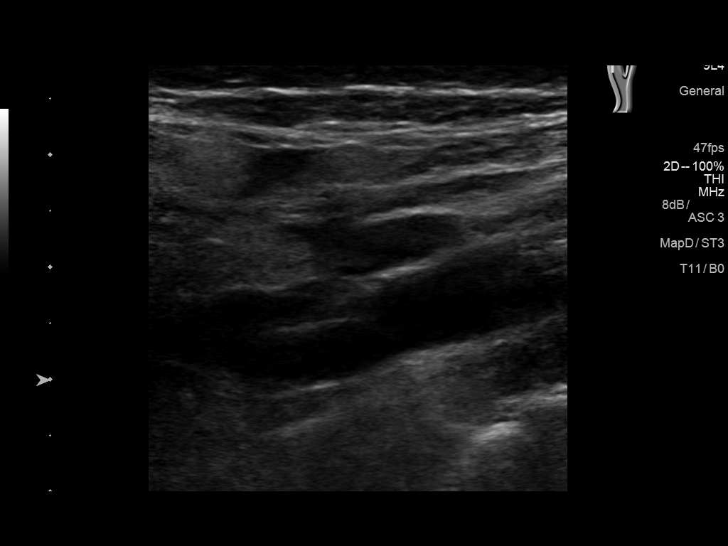
[im 33/68]
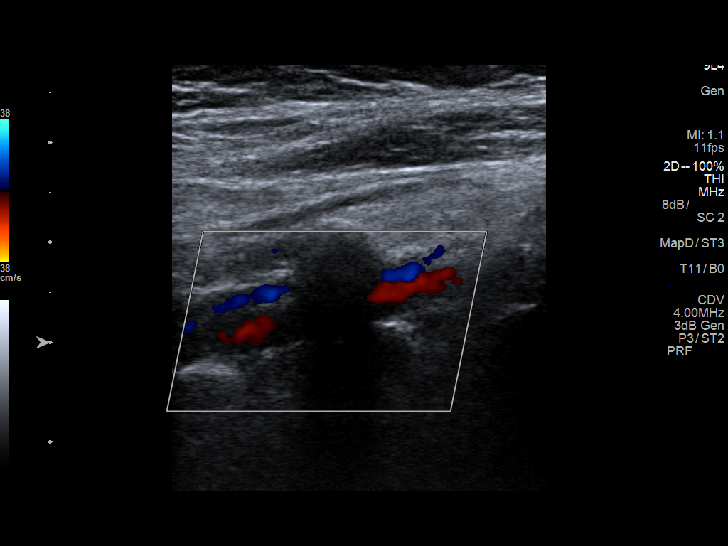
[im 35/68]
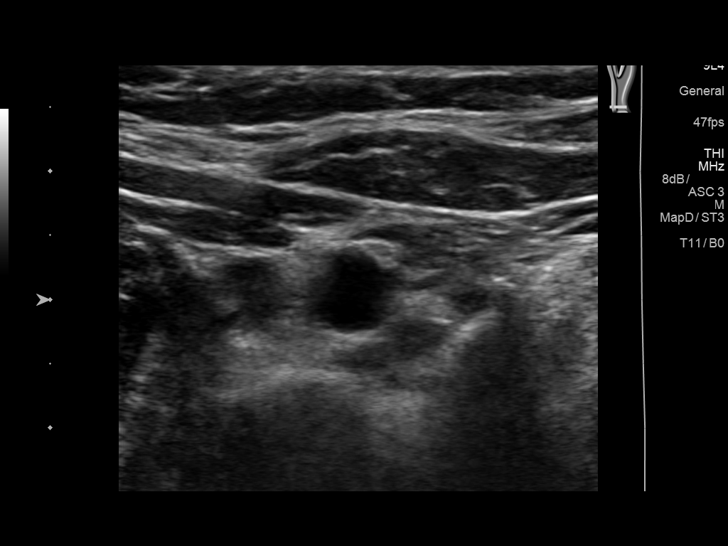
[im 41/68]
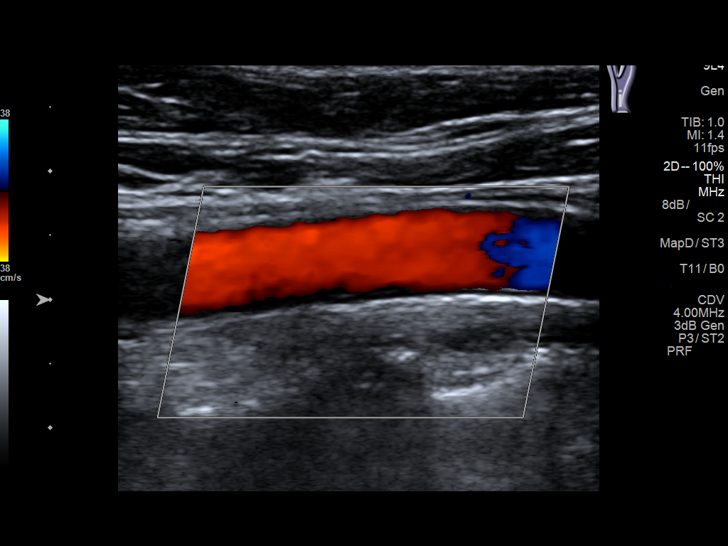
[im 47/68]
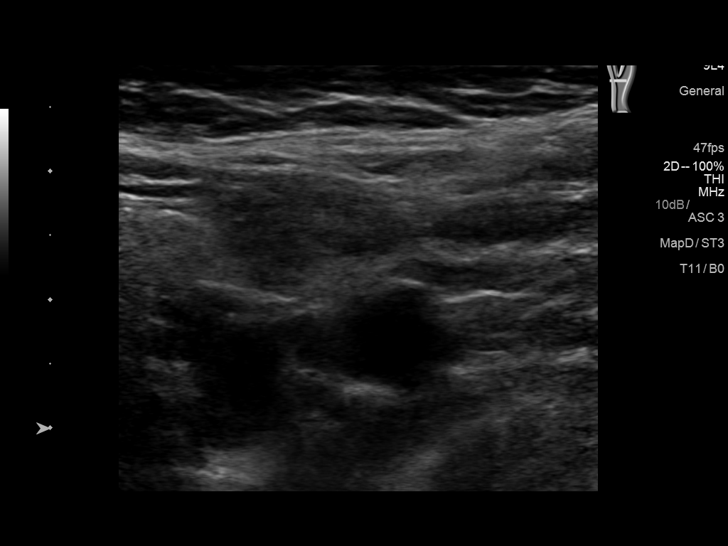
[im 53/68]
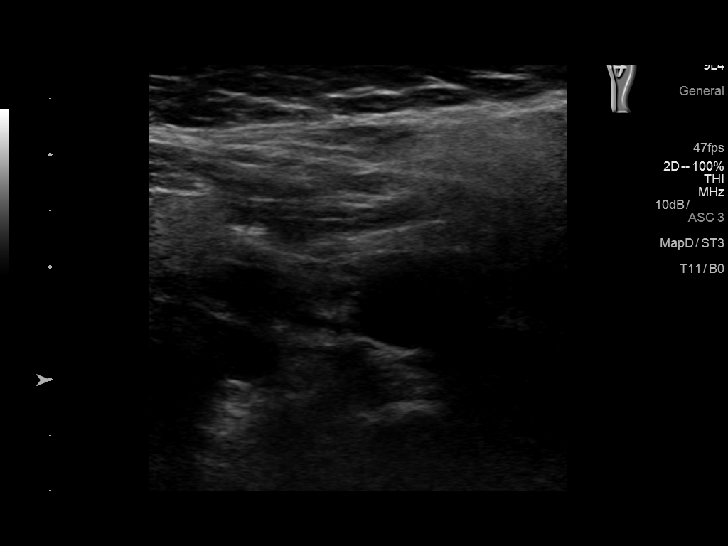
[im 56/68]
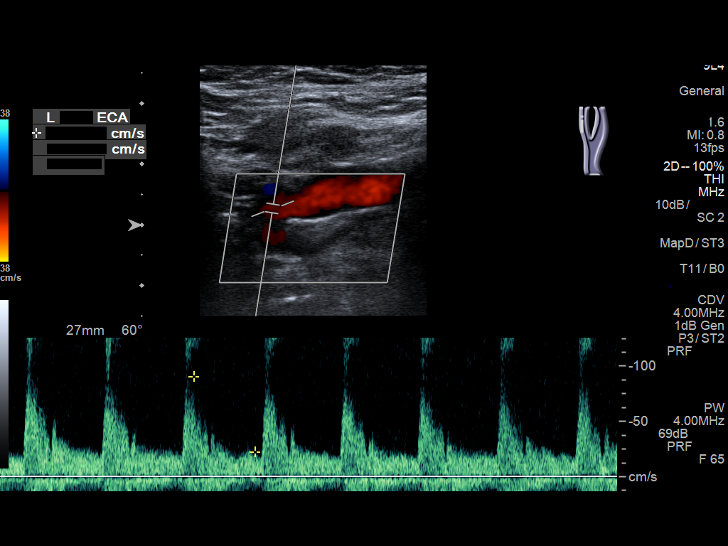
[im 62/68]
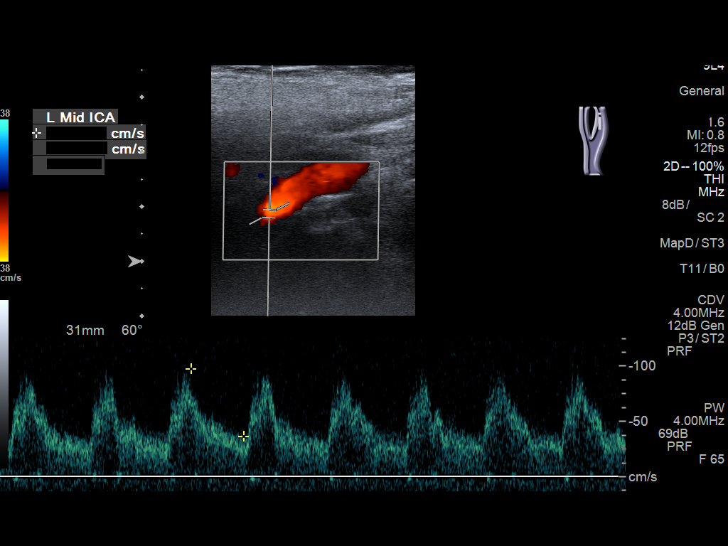
[im 68/68]
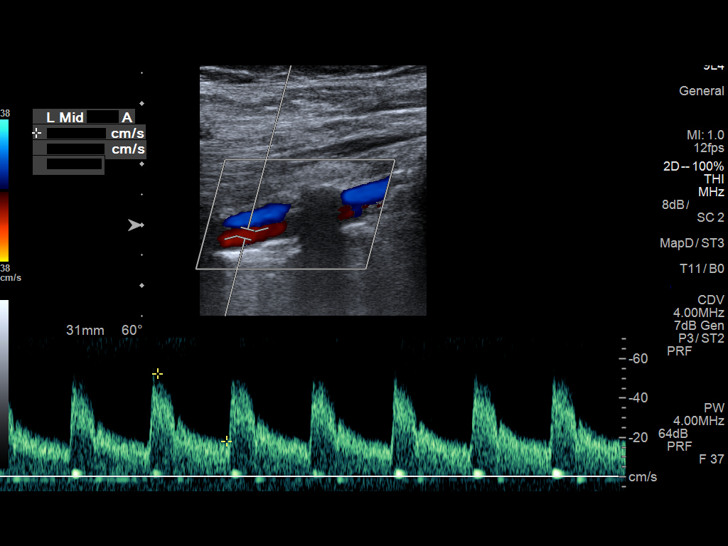

[14 of 24 positions shown; findings below may reference images not displayed]

FINDINGS: Criteria: Quantification of carotid stenosis is based on velocity
parameters that correlate the residual internal carotid diameter
with NASCET-based stenosis levels, using the diameter of the distal
internal carotid lumen as the denominator for stenosis measurement.

The following velocity measurements were obtained:

RIGHT

ICA:  112 cm/sec

CCA:  126 cm/sec

SYSTOLIC ICA/CCA RATIO:

DIASTOLIC ICA/CCA RATIO:

ECA:  94 cm/sec

LEFT

ICA:  102 cm/sec

CCA:  102 cm/sec

SYSTOLIC ICA/CCA RATIO:  1

DIASTOLIC ICA/CCA RATIO:

ECA:  91 cm/sec

RIGHT CAROTID ARTERY: Little if any plaque in the bulb. Low
resistance internal carotid Doppler pattern.

RIGHT VERTEBRAL ARTERY:  Antegrade.

LEFT CAROTID ARTERY: Little if any plaque in the bulb. Low
resistance internal carotid Doppler pattern.

LEFT VERTEBRAL ARTERY:  Antegrade.
IMPRESSION: Less than 50% stenosis in the right and left internal carotid
arteries.

## 2018-08-21 ENCOUNTER — Encounter (INDEPENDENT_AMBULATORY_CARE_PROVIDER_SITE_OTHER): Payer: Self-pay | Admitting: Vascular Surgery

## 2018-08-21 ENCOUNTER — Ambulatory Visit (INDEPENDENT_AMBULATORY_CARE_PROVIDER_SITE_OTHER): Payer: Managed Care, Other (non HMO)

## 2018-08-21 ENCOUNTER — Ambulatory Visit (INDEPENDENT_AMBULATORY_CARE_PROVIDER_SITE_OTHER): Payer: Managed Care, Other (non HMO) | Admitting: Vascular Surgery

## 2018-08-21 VITALS — BP 147/86 | HR 56 | Resp 18 | Ht 65.0 in | Wt 210.0 lb

## 2018-08-21 DIAGNOSIS — R0989 Other specified symptoms and signs involving the circulatory and respiratory systems: Secondary | ICD-10-CM | POA: Diagnosis not present

## 2018-08-21 DIAGNOSIS — E785 Hyperlipidemia, unspecified: Secondary | ICD-10-CM | POA: Diagnosis not present

## 2018-08-21 DIAGNOSIS — K219 Gastro-esophageal reflux disease without esophagitis: Secondary | ICD-10-CM

## 2018-08-23 ENCOUNTER — Encounter (INDEPENDENT_AMBULATORY_CARE_PROVIDER_SITE_OTHER): Payer: Self-pay | Admitting: Vascular Surgery

## 2018-08-23 NOTE — Progress Notes (Signed)
MRN : [id number]  Cheryl Campos is a 62 y.o. (1956-06-28) female who presents with chief complaint of  Chief Complaint  Patient presents with  . Follow-up    1 year Carotid follow up  .  History of Present Illness:   The patient is seen for follow up evaluation of carotid pulsation possible aneurysm. The carotid stenosis followed by ultrasound.   The patient denies amaurosis fugax. There is no recent history of TIA symptoms or focal motor deficits. There is no prior documented CVA.  The patient is taking enteric-coated aspirin 81 mg daily.  There is no history of migraine headaches. There is no history of seizures.  The patient has a history of coronary artery disease, no recent episodes of angina or shortness of breath. The patient denies PAD or claudication symptoms. There is a history of hyperlipidemia which is being treated with a statin.    Carotid Duplex done today shows <30% stenosis no aneurysm.  No change compared to last study  Current Meds  Medication Sig  . atorvastatin (LIPITOR) 10 MG tablet Take 1 tablet (10 mg total) by mouth at bedtime.  Marland Kitchen levothyroxine (SYNTHROID, LEVOTHROID) 125 MCG tablet TAKE 1 TABLET ON AN EMPTY STOMACH WITH A GLASS OF WATER AT LEAST 30 TO 60 MINUTES BEFORE BREAKFAST.  Marland Kitchen omeprazole (PRILOSEC) 40 MG capsule Take 1 capsule (40 mg total) by mouth daily.  . propranolol (INDERAL) 80 MG tablet Take 1 tablet (80 mg total) by mouth 2 (two) times daily. Dr Sherryll Burger  . valACYclovir (VALTREX) 500 MG tablet TAKE (1) TABLET BY MOUTH TWICE DAILY AS NEEDED FOR FLARE UP    Past Medical History:  Diagnosis Date  . Dystonic tremor    head, neck  . GERD (gastroesophageal reflux disease)   . Hypercholesteremia   . Thyroid disease   . Tremors of nervous system     Past Surgical History:  Procedure Laterality Date  . ADENOIDECTOMY    . COLONOSCOPY  2008   [place]  . COLONOSCOPY WITH PROPOFOL N/A 07/29/2017   Procedure: COLONOSCOPY WITH PROPOFOL;   Surgeon: Midge Minium, MD;  Location: [facility];  Service: Gastroenterology;  Laterality: N/A;  . ESOPHAGOGASTRODUODENOSCOPY (EGD) WITH PROPOFOL N/A 07/29/2017   Procedure: ESOPHAGOGASTRODUODENOSCOPY (EGD) WITH PROPOFOL;  Surgeon: Midge Minium, MD;  Location: [facility];  Service: Gastroenterology;  Laterality: N/A;  requests early  . POLYPECTOMY  07/29/2017   Procedure: POLYPECTOMY;  Surgeon: Midge Minium, MD;  Location: [facility];  Service: Gastroenterology;;    Social History Social History   Tobacco Use  . Smoking status: Never Smoker  . Smokeless tobacco: Never Used  Substance Use Topics  . Alcohol use: No  . Drug use: No    Family History Family History  Problem Relation Age of Onset  . Cancer Father   . Heart disease Father   . Cancer Brother   . Diabetes Paternal Grandmother     No Known Allergies   REVIEW OF SYSTEMS (Negative unless checked)  Constitutional: [] Weight loss  [] Fever  [] Chills Cardiac: [] Chest pain   [] Chest pressure   [] Palpitations   [] Shortness of breath when laying flat   [] Shortness of breath with exertion. Vascular:  [] Pain in legs with walking   [] Pain in legs at rest  [] History of DVT   [] Phlebitis   [] Swelling in legs   [] Varicose veins   [] Non-healing ulcers Pulmonary:   [] Uses home oxygen   [] Productive cough   [] Hemoptysis   [] Wheeze  []   COPD   [] Asthma Neurologic:  [] Dizziness   [] Seizures   [] History of stroke   [] History of TIA  [] Aphasia   [] Vissual changes   [] Weakness or numbness in arm   [] Weakness or numbness in leg Musculoskeletal:   [] Joint swelling   [] Joint pain   [] Low back pain Hematologic:  [] Easy bruising  [] Easy bleeding   [] Hypercoagulable state   [] Anemic Gastrointestinal:  [] Diarrhea   [] Vomiting  [] Gastroesophageal reflux/heartburn   [] Difficulty swallowing. Genitourinary:  [] Chronic kidney disease   [] Difficult urination  [] Frequent urination   [] Blood in urine Skin:  [] Rashes   [] Ulcers    Psychological:  [] History of anxiety   []  History of major depression.  Physical Examination  Vitals:   08/21/18 1131 08/21/18 1132  BP: (!) 148/84 (!) 147/86  Pulse: (!) 56 (!) 56  Resp: 18   Weight: 210 lb (95.3 kg)   Height: 5\' 5"  (1.651 m)    Body mass index is 34.95 kg/m. Gen: WD/WN, NAD Head: [place]/AT, No temporalis wasting.  Ear/Nose/Throat: Hearing grossly intact, nares w/o erythema or drainage Eyes: PER, EOMI, sclera nonicteric.  Neck: Supple, no large masses.   Pulmonary:  Good air movement, no audible wheezing bilaterally, no use of accessory muscles.  Cardiac: RRR, no JVD Vascular: carotid pulse easily palpable Vessel Right Left  Radial Palpable Palpable  Brachial Palpable Palpable  Carotid Palpable Palpable  Gastrointestinal: Non-distended. No guarding/no peritoneal signs.  Musculoskeletal: M/S 5/5 throughout.  No deformity or atrophy.  Neurologic: CN 2-12 intact. Symmetrical.  Speech is fluent. Motor exam as listed above. Psychiatric: Judgment intact, Mood & affect appropriate for pt's clinical situation. Dermatologic: No rashes or ulcers noted.  No changes consistent with cellulitis. Lymph : No lichenification or skin changes of chronic lymphedema.  CBC Lab Results  Component Value Date   WBC 12.4 (H) 03/14/2017   HGB 13.5 03/14/2017   HCT 41.8 03/14/2017   MCV 88.2 03/14/2017   PLT 232 03/14/2017    BMET    Component Value Date/Time   NA 142 05/30/2017 0923   K 4.1 05/30/2017 0923   CL 103 05/30/2017 0923   CO2 24 05/30/2017 0923   GLUCOSE 95 05/30/2017 0923   GLUCOSE 109 (H) 03/14/2017 0003   BUN 10 05/30/2017 0923   CREATININE 0.70 05/30/2017 0923   CALCIUM 9.1 05/30/2017 0923   GFRNONAA 95 05/30/2017 0923   GFRAA 110 05/30/2017 0923   CrCl cannot be calculated (Patient's most recent lab result is older than the maximum 21 days allowed.).  COAG No results found for: INR, PROTIME  Radiology No results found.   Assessment/Plan 1.  Abnormal carotid pulse Recommend:  Given the patient's asymptomatic subcritical stenosis no further invasive testing or surgery at this time.  Duplex ultrasound shows minimal stenosis bilaterally no evidence of increasing size or aneurysmal deterioration.  Continue antiplatelet therapy as prescribed Continue management of CAD, HTN and Hyperlipidemia Healthy heart diet,  encouraged exercise at least 4 times per week Follow up PRN  2. Gastroesophageal reflux disease, esophagitis presence not specified Continue PPI as already ordered, this medication has been reviewed and there are no changes at this time.  Avoidence of caffeine and alcohol  Moderate elevation of the head of the bed   3. Hyperlipidemia, unspecified hyperlipidemia type Continue statin as ordered and reviewed, no changes at this time     Levora Dredge, MD  08/23/2018 11:57 AM

## 2018-09-04 ENCOUNTER — Other Ambulatory Visit: Payer: Self-pay | Admitting: Family Medicine

## 2018-09-04 DIAGNOSIS — K219 Gastro-esophageal reflux disease without esophagitis: Secondary | ICD-10-CM

## 2018-09-27 ENCOUNTER — Other Ambulatory Visit: Payer: Self-pay | Admitting: Family Medicine

## 2018-09-27 DIAGNOSIS — E039 Hypothyroidism, unspecified: Secondary | ICD-10-CM

## 2018-09-27 DIAGNOSIS — E782 Mixed hyperlipidemia: Secondary | ICD-10-CM

## 2019-01-15 ENCOUNTER — Other Ambulatory Visit: Payer: Self-pay | Admitting: Family Medicine

## 2019-01-15 DIAGNOSIS — E782 Mixed hyperlipidemia: Secondary | ICD-10-CM

## 2019-02-05 ENCOUNTER — Encounter: Payer: Self-pay | Admitting: Family Medicine

## 2019-02-05 ENCOUNTER — Ambulatory Visit (INDEPENDENT_AMBULATORY_CARE_PROVIDER_SITE_OTHER): Payer: Managed Care, Other (non HMO) | Admitting: Family Medicine

## 2019-02-05 DIAGNOSIS — R251 Tremor, unspecified: Secondary | ICD-10-CM

## 2019-02-05 DIAGNOSIS — K219 Gastro-esophageal reflux disease without esophagitis: Secondary | ICD-10-CM

## 2019-02-05 DIAGNOSIS — E039 Hypothyroidism, unspecified: Secondary | ICD-10-CM

## 2019-02-05 DIAGNOSIS — E782 Mixed hyperlipidemia: Secondary | ICD-10-CM

## 2019-02-05 DIAGNOSIS — K120 Recurrent oral aphthae: Secondary | ICD-10-CM | POA: Diagnosis not present

## 2019-02-05 MED ORDER — PROPRANOLOL HCL 80 MG PO TABS: 80.0000 mg | ORAL_TABLET | Freq: Two times a day (BID) | ORAL | 1 refills | Status: DC

## 2019-02-05 MED ORDER — VALACYCLOVIR HCL 500 MG PO TABS: ORAL_TABLET | ORAL | 5 refills | Status: DC

## 2019-02-05 MED ORDER — OMEPRAZOLE 40 MG PO CPDR: DELAYED_RELEASE_CAPSULE | ORAL | 1 refills | Status: DC

## 2019-02-05 MED ORDER — ATORVASTATIN CALCIUM 10 MG PO TABS: ORAL_TABLET | ORAL | 1 refills | Status: DC

## 2019-02-05 MED ORDER — LEVOTHYROXINE SODIUM 125 MCG PO TABS: ORAL_TABLET | ORAL | 1 refills | Status: DC

## 2019-02-05 NOTE — Progress Notes (Signed)
Date:  02/05/2019   Name:  Cheryl Campos   DOB:  09-11-1956   MRN:  [id number]   Chief Complaint: Gastroesophageal Reflux; Hypothyroidism; Hyperlipidemia; Tremors; and Mouth Lesions  I connected withthis patient, Rickey Farrier, by telephoneat the patient's home.  I verified that I am speaking with the correct person using two identifiers. This visit was conducted via telephone due to the Covid-19 outbreak from my office at [facility] in [place], [place]. I discussed the limitations, risks, security and privacy concerns of performing an evaluation and management service by telephone. I also discussed with the patient that there may be a patient responsible charge related to this service. The patient expressed understanding and agreed to proceed.  Gastroesophageal Reflux  She reports no abdominal pain, no belching, no chest pain, no choking, no coughing, no dysphagia, no early satiety, no globus sensation, no heartburn, no hoarse voice, no nausea, no sore throat, no stridor, no tooth decay, no water brash or no wheezing. diet coke/or if miss dose. This is a chronic problem. The current episode started in the past 7 days. The problem occurs occasionally. The problem has been unchanged. The symptoms are aggravated by certain foods (diet coke). Pertinent negatives include no anemia, fatigue, melena, muscle weakness, orthopnea or weight loss. There are no known risk factors. She has tried a PPI for the symptoms. The treatment provided moderate relief.  Hyperlipidemia  This is a chronic problem. The current episode started more than 1 year ago. The problem is controlled. Recent lipid tests were reviewed and are normal. She has no history of chronic renal disease, diabetes, hypothyroidism, liver disease, obesity or nephrotic syndrome. There are no known factors aggravating her hyperlipidemia. Pertinent negatives include no chest pain, focal weakness, myalgias or shortness of breath. (Cramps) Current  antihyperlipidemic treatment includes statins. The current treatment provides moderate improvement of lipids. There are no compliance problems.   Mouth Lesions   The current episode started more than 2 weeks ago. The onset was gradual. The problem occurs occasionally. The problem is mild. Associated symptoms include mouth sores. Pertinent negatives include no fever, no eye itching, no photophobia, no abdominal pain, no constipation, no diarrhea, no nausea, no vomiting, no congestion, no ear discharge, no ear pain, no headaches, no rhinorrhea, no sore throat, no stridor, no cough, no wheezing, no rash, no eye discharge, no eye pain and no eye redness.    Review of Systems  Constitutional: Negative.  Negative for chills, fatigue, fever, unexpected weight change and weight loss.  HENT: Positive for mouth sores and trouble swallowing. Negative for congestion, ear discharge, ear pain, hoarse voice, rhinorrhea, sinus pressure, sneezing and sore throat.   Eyes: Negative for photophobia, pain, discharge, redness and itching.  Respiratory: Negative for cough, choking, shortness of breath, wheezing and stridor.   Cardiovascular: Negative for chest pain.  Gastrointestinal: Negative for abdominal pain, blood in stool, constipation, diarrhea, dysphagia, heartburn, melena, nausea and vomiting.  Endocrine: Negative for cold intolerance, heat intolerance, polydipsia, polyphagia and polyuria.  Genitourinary: Negative for dysuria, flank pain, frequency, hematuria, menstrual problem, pelvic pain, urgency, vaginal bleeding and vaginal discharge.  Musculoskeletal: Negative for arthralgias, back pain, myalgias and muscle weakness.  Skin: Negative for rash.  Allergic/Immunologic: Negative for environmental allergies and food allergies.  Neurological: Negative for dizziness, focal weakness, weakness, light-headedness, numbness and headaches.  Hematological: Negative for adenopathy. Does not bruise/bleed easily.   Psychiatric/Behavioral: Negative for dysphoric mood. The patient is not nervous/anxious.     Patient  Active Problem List   Diagnosis Date Noted  . 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 Abnormal carotid pulse 08/27/2017  . History of colon polyps   . Rectal polyp   . Esophageal dysphagia   . Gastritis without bleeding   . Tremor 06/11/2016  . GERD (gastroesophageal reflux disease) 06/11/2016  . Hyperlipidemia 06/11/2016  . Fever blister 06/11/2016  . Thyroid activity decreased 06/11/2016  . EPIGASTRIC PAIN 04/20/2009  . NONSPECIFIC ABN FINDING RAD & OTH EXAM GI TRACT 04/20/2009    No Known Allergies  Past Surgical History:  Procedure Laterality Date  . ADENOIDECTOMY    . COLONOSCOPY  2008   [place]  . COLONOSCOPY WITH PROPOFOL N/A 07/29/2017   Procedure: COLONOSCOPY WITH PROPOFOL;  Surgeon: Midge MiniumWohl, Darren, MD;  [facility];  Service: Gastroenterology;  Laterality: N/A;  . ESOPHAGOGASTRODUODENOSCOPY (EGD) WITH PROPOFOL N/A 07/29/2017   Procedure: ESOPHAGOGASTRODUODENOSCOPY (EGD) WITH PROPOFOL;  Surgeon: Midge MiniumWohl, Darren, MD;  [facility];  Service: Gastroenterology;  Laterality: N/A;  requests early  . POLYPECTOMY  07/29/2017   Procedure: POLYPECTOMY;  Surgeon: Midge MiniumWohl, Darren, MD;  [facility];  Service: Gastroenterology;;    Social History   Tobacco Use  . Smoking status: Never Smoker  . Smokeless tobacco: Never Used  Substance Use Topics  . Alcohol use: No  . Drug use: No     Medication list has been reviewed and updated.  Current Meds  Medication Sig  . atorvastatin (LIPITOR) 10 MG tablet TAKE ONE TABLET AT BEDTIME. **NEED APPT**  . levothyroxine (SYNTHROID, LEVOTHROID) 125 MCG tablet TAKE (1) TABLET BY MOUTH EVERY DAY  . omeprazole (PRILOSEC) 40 MG capsule TAKE ONE (1) CAPSULE EACH DAY.  Marland Kitchen. propranolol (INDERAL) 80 MG tablet Take 1 tablet (80 mg total) by mouth 2 (two) times daily. Dr Sherryll BurgerShah  . valACYclovir (VALTREX)  500 MG tablet TAKE (1) TABLET BY MOUTH TWICE DAILY AS NEEDED FOR FLARE UP    PHQ 2/9 Scores 02/05/2019 05/30/2017 05/30/2017  PHQ - 2 Score 0 0 0  PHQ- 9 Score 1 0 -    BP Readings from Last 3 Encounters:  02/05/19 122/76  08/21/18 (!) 147/86  03/17/18 120/80    Physical Exam Vitals signs and nursing note reviewed.  Constitutional:      General: She is not in acute distress.    Appearance: She is not diaphoretic.  HENT:     Head: Normocephalic and atraumatic.     Right Ear: External ear normal.     Left Ear: External ear normal.     Nose: Nose normal.  Eyes:     General:        Right eye: No discharge.        Left eye: No discharge.     Conjunctiva/sclera: Conjunctivae normal.     Pupils: Pupils are equal, round, and reactive to light.  Neck:     Musculoskeletal: Normal range of motion and neck supple.     Thyroid: No thyromegaly.     Vascular: No JVD.  Cardiovascular:     Rate and Rhythm: Normal rate.  Abdominal:     General: Bowel sounds are normal.     Palpations: Abdomen is soft. There is no mass.     Tenderness: There is no abdominal tenderness. There is no guarding.  Lymphadenopathy:     Cervical: No cervical adenopathy.  Neurological:     Mental Status: She is alert.     Deep Tendon Reflexes: Reflexes are normal and  symmetric.     Wt Readings from Last 3 Encounters:  02/05/19 208 lb (94.3 kg)  08/21/18 210 lb (95.3 kg)  03/17/18 210 lb (95.3 kg)    BP 122/76   Pulse 68   Ht 5\' 5"  (1.651 m)   Wt 208 lb (94.3 kg)   BMI 34.61 kg/m   Assessment and Plan: I spent 10 minutes with this patient, More than 50% of that time was spent in voice to voice education, counseling and care coordination. 1. Gastroesophageal reflux disease, esophagitis presence not specified Chronic.  Controlled.  Continue omeprazole 40 mg once a day. - omeprazole (PRILOSEC) 40 MG capsule; TAKE ONE (1) CAPSULE EACH DAY.  Dispense: 90 capsule; Refill: 1  2. Hypothyroidism, unspecified  type Patient has hypothyroid which is likely due to gradual atrophy over years reviewed last TSH and it was still in the 6 range however will continue levothyroxine at 125 mcg. - levothyroxine (SYNTHROID, LEVOTHROID) 125 MCG tablet; TAKE (1) TABLET BY MOUTH EVERY DAY  Dispense: 90 tablet; Refill: 1  3. Mixed hyperlipidemia Has a history of hyperlipidemia which is combination of cholesterol and triglyceride elevation.  Will continue atorvastatin 10 mg nightly - atorvastatin (LIPITOR) 10 MG tablet; TAKE ONE TABLET AT BEDTIME.  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  Will fill the Valtrex to be used as needed for flareups. - valACYclovir (VALTREX) 500 MG tablet; TAKE (1) TABLET BY MOUTH TWICE DAILY AS NEEDED FOR FLARE UP  Dispense: 30 tablet; Refill: 5  5. Tremor And is followed by Dr. Clelia Croft for tremor which includes some head motion.  Patient will continue on propranolol 80 mg twice a day as needed. - propranolol (INDERAL) 80 MG tablet; Take 1 tablet (80 mg total) by mouth 2 (two) times daily. Dr Sherryll Burger  Dispense: 180 tablet; Refill: 1

## 2019-08-05 ENCOUNTER — Other Ambulatory Visit: Payer: Self-pay | Admitting: Family Medicine

## 2019-08-05 DIAGNOSIS — E782 Mixed hyperlipidemia: Secondary | ICD-10-CM

## 2019-09-11 ENCOUNTER — Encounter: Payer: Self-pay | Admitting: Family Medicine

## 2019-09-11 ENCOUNTER — Other Ambulatory Visit: Payer: Self-pay

## 2019-09-11 ENCOUNTER — Ambulatory Visit (INDEPENDENT_AMBULATORY_CARE_PROVIDER_SITE_OTHER): Payer: Managed Care, Other (non HMO) | Admitting: Family Medicine

## 2019-09-11 VITALS — BP 130/70 | HR 72 | Ht 65.0 in | Wt 212.0 lb

## 2019-09-11 DIAGNOSIS — Z23 Encounter for immunization: Secondary | ICD-10-CM | POA: Diagnosis not present

## 2019-09-11 DIAGNOSIS — K219 Gastro-esophageal reflux disease without esophagitis: Secondary | ICD-10-CM

## 2019-09-11 DIAGNOSIS — E039 Hypothyroidism, unspecified: Secondary | ICD-10-CM | POA: Diagnosis not present

## 2019-09-11 DIAGNOSIS — E782 Mixed hyperlipidemia: Secondary | ICD-10-CM | POA: Diagnosis not present

## 2019-09-11 MED ORDER — ATORVASTATIN CALCIUM 10 MG PO TABS: ORAL_TABLET | ORAL | 1 refills | Status: DC

## 2019-09-11 MED ORDER — OMEPRAZOLE 40 MG PO CPDR: DELAYED_RELEASE_CAPSULE | ORAL | 1 refills | Status: DC

## 2019-09-11 MED ORDER — LEVOTHYROXINE SODIUM 125 MCG PO TABS: ORAL_TABLET | ORAL | 1 refills | Status: DC

## 2019-09-11 NOTE — Patient Instructions (Signed)

## 2019-09-11 NOTE — Progress Notes (Signed)
Date:  09/11/2019   Name:  Cheryl Campos   DOB:  06-25-1956   MRN:  [id number]   Chief Complaint: Hyperlipidemia, Gastroesophageal Reflux, Hypothyroidism, and Flu Vaccine  Hyperlipidemia This is a chronic problem. The current episode started more than 1 year ago. The problem is controlled. Recent lipid tests were reviewed and are normal. She has no history of chronic renal disease, diabetes, hypothyroidism, liver disease, obesity or nephrotic syndrome. There are no known factors aggravating her hyperlipidemia. Pertinent negatives include no chest pain, focal sensory loss, focal weakness, leg pain, myalgias or shortness of breath. Current antihyperlipidemic treatment includes statins. The current treatment provides moderate improvement of lipids. There are no compliance problems.  Risk factors for coronary artery disease include dyslipidemia and hypertension.  Gastroesophageal Reflux She reports no abdominal pain, no belching, no chest pain, no choking, no coughing, no dysphagia, no early satiety, no heartburn, no hoarse voice, no nausea, no sore throat, no stridor or no wheezing. This is a chronic problem. The problem occurs occasionally. The problem has been resolved. The symptoms are aggravated by certain foods. Associated symptoms include fatigue. Pertinent negatives include no anemia, melena, muscle weakness, orthopnea or weight loss. She has tried a PPI for the symptoms.  Thyroid Problem Presents for follow-up visit. Symptoms include fatigue. Patient reports no anxiety, cold intolerance, constipation, depressed mood, diaphoresis, diarrhea, dry skin, hair loss, heat intolerance, hoarse voice, leg swelling, menstrual problem, nail problem, palpitations, tremors, visual change, weight gain or weight loss. The symptoms have been stable. Her past medical history is significant for hyperlipidemia. There is no history of diabetes.    Lab Results  Component Value Date   CREATININE 0.70 05/30/2017    BUN 10 05/30/2017   NA 142 05/30/2017   K 4.1 05/30/2017   CL 103 05/30/2017   CO2 24 05/30/2017   Lab Results  Component Value Date   CHOL 170 03/17/2018   HDL 49 03/17/2018   LDLCALC 92 03/17/2018   TRIG 145 03/17/2018   CHOLHDL 3.5 03/17/2018   Lab Results  Component Value Date   TSH 1.200 03/17/2018   No results found for: HGBA1C   Review of Systems  Constitutional: Positive for fatigue. Negative for chills, diaphoresis, fever, unexpected weight change, weight gain and weight loss.  HENT: Negative for congestion, ear discharge, ear pain, hoarse voice, postnasal drip, rhinorrhea, sinus pressure, sneezing and sore throat.   Eyes: Negative for photophobia, pain, discharge, redness and itching.  Respiratory: Negative for cough, choking, shortness of breath, wheezing and stridor.   Cardiovascular: Negative for chest pain, palpitations and leg swelling.  Gastrointestinal: Negative for abdominal pain, blood in stool, constipation, diarrhea, dysphagia, heartburn, melena, nausea and vomiting.  Endocrine: Negative for cold intolerance, heat intolerance, polydipsia, polyphagia and polyuria.  Genitourinary: Negative for dysuria, flank pain, frequency, hematuria, menstrual problem, pelvic pain, urgency, vaginal bleeding and vaginal discharge.  Musculoskeletal: Negative for arthralgias, back pain, myalgias and muscle weakness.  Skin: Negative for rash.  Allergic/Immunologic: Negative for environmental allergies and food allergies.  Neurological: Negative for dizziness, tremors, focal weakness, weakness, light-headedness, numbness and headaches.  Hematological: Negative for adenopathy. Does not bruise/bleed easily.  Psychiatric/Behavioral: Negative for dysphoric mood. The patient is not nervous/anxious.     Patient Active Problem List   Diagnosis Date Noted  . 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 Abnormal carotid pulse 08/27/2017  . History of colon polyps   . Rectal polyp   .  Esophageal dysphagia   . Gastritis without bleeding   .  Tremor 06/11/2016  . GERD (gastroesophageal reflux disease) 06/11/2016  . Hyperlipidemia 06/11/2016  . Fever blister 06/11/2016  . Thyroid activity decreased 06/11/2016  . EPIGASTRIC PAIN 04/20/2009  . NONSPECIFIC ABN FINDING RAD & OTH EXAM GI TRACT 04/20/2009    No Known Allergies  Past Surgical History:  Procedure Laterality Date  . ADENOIDECTOMY    . COLONOSCOPY  2008   [place]  . COLONOSCOPY WITH PROPOFOL N/A 07/29/2017   Procedure: COLONOSCOPY WITH PROPOFOL;  Surgeon: Midge Minium, MD;  [facility];  Service: Gastroenterology;  Laterality: N/A;  . ESOPHAGOGASTRODUODENOSCOPY (EGD) WITH PROPOFOL N/A 07/29/2017   Procedure: ESOPHAGOGASTRODUODENOSCOPY (EGD) WITH PROPOFOL;  Surgeon: Midge Minium, MD;  Location: [facility];  Service: Gastroenterology;  Laterality: N/A;  requests early  . POLYPECTOMY  07/29/2017   Procedure: POLYPECTOMY;  Surgeon: Midge Minium, MD;  Location: [facility];  Service: Gastroenterology;;    Social History   Tobacco Use  . Smoking status: Never Smoker  . Smokeless tobacco: Never Used  Substance Use Topics  . Alcohol use: No  . Drug use: No     Medication list has been reviewed and updated.  Current Meds  Medication Sig  . atorvastatin (LIPITOR) 10 MG tablet TAKE (1) TABLET BY MOUTH DAILY AT BEDTIME  . levothyroxine (SYNTHROID, LEVOTHROID) 125 MCG tablet TAKE (1) TABLET BY MOUTH EVERY DAY  . omeprazole (PRILOSEC) 40 MG capsule TAKE ONE (1) CAPSULE EACH DAY.  Marland Kitchen propranolol (INDERAL) 80 MG tablet Take 1 tablet (80 mg total) by mouth 2 (two) times daily. Dr Sherryll Burger  . [DISCONTINUED] cyclobenzaprine (FLEXERIL) 5 MG tablet Take 5 mg by mouth 3 (three) times daily as needed for muscle spasms.    PHQ 2/9 Scores 02/05/2019 05/30/2017 05/30/2017  PHQ - 2 Score 0 0 0  PHQ- 9 Score 1 0 -    BP Readings from Last 3 Encounters:  09/11/19 130/70  02/05/19 122/76  08/21/18  (!) 147/86    Physical Exam Vitals signs and nursing note reviewed.  Constitutional:      Appearance: She is well-developed.  HENT:     Head: Normocephalic.     Right Ear: Tympanic membrane, ear canal and external ear normal.     Left Ear: Tympanic membrane, ear canal and external ear normal.  Eyes:     General: Lids are everted, no foreign bodies appreciated. No scleral icterus.       Left eye: No foreign body or hordeolum.     Conjunctiva/sclera: Conjunctivae normal.     Right eye: Right conjunctiva is not injected.     Left eye: Left conjunctiva is not injected.     Pupils: Pupils are equal, round, and reactive to light.  Neck:     Musculoskeletal: Full passive range of motion without pain, normal range of motion and neck supple.     Thyroid: No thyroid mass, thyromegaly or thyroid tenderness.     Vascular: No JVD.     Trachea: No tracheal deviation.  Cardiovascular:     Rate and Rhythm: Normal rate and regular rhythm.     Pulses: Normal pulses.          Carotid pulses are 2+ on the right side and 2+ on the left side.      Radial pulses are 2+ on the right side and 2+ on the left side.       Femoral pulses are 2+ on the right side and 2+ on the left side.  Popliteal pulses are 2+ on the right side and 2+ on the left side.       Dorsalis pedis pulses are 2+ on the right side and 2+ on the left side.       Posterior tibial pulses are 2+ on the right side and 2+ on the left side.     Heart sounds: Normal heart sounds, S1 normal and S2 normal. No murmur. No systolic murmur. No diastolic murmur. No friction rub. No gallop. No S3 sounds.   Pulmonary:     Effort: Pulmonary effort is normal. No respiratory distress.     Breath sounds: Normal breath sounds. No decreased breath sounds, wheezing, rhonchi or rales.  Abdominal:     General: Bowel sounds are normal.     Palpations: Abdomen is soft. There is no mass.     Tenderness: There is no abdominal tenderness. There is no  guarding or rebound.  Musculoskeletal: Normal range of motion.        General: No tenderness.     Right lower leg: No edema.     Left lower leg: No edema.  Lymphadenopathy:     Cervical: No cervical adenopathy.  Skin:    General: Skin is warm.     Findings: No rash.  Neurological:     Mental Status: She is alert and oriented to person, place, and time.     Cranial Nerves: No cranial nerve deficit.     Deep Tendon Reflexes: Reflexes normal.  Psychiatric:        Mood and Affect: Mood is not anxious or depressed.     Wt Readings from Last 3 Encounters:  09/11/19 212 lb (96.2 kg)  02/05/19 208 lb (94.3 kg)  08/21/18 210 lb (95.3 kg)    BP 130/70   Pulse 72   Ht 5\' 5"  (1.651 m)   Wt 212 lb (96.2 kg)   BMI 35.28 kg/m   Assessment and Plan: 1. Hypothyroidism, unspecified type Chronic.  Controlled.  Stable.  Continue levothyroxine 125 mcg daily.  And will check TSH to see if this is a sufficient regimen. - TSH - levothyroxine (SYNTHROID) 125 MCG tablet; TAKE (1) TABLET BY MOUTH EVERY DAY  Dispense: 90 tablet; Refill: 1  2. Mixed hyperlipidemia Chronic.  Controlled.  Stable.  Will continue atorvastatin 10 mg once a day.  And check in renal panel and lipid panel for appropriate levels. - Renal Function Panel - Lipid panel - atorvastatin (LIPITOR) 10 MG tablet; TAKE (1) TABLET BY MOUTH DAILY AT BEDTIME  Dispense: 90 tablet; Refill: 1  3. Gastroesophageal reflux disease, unspecified whether esophagitis present Chronic.  Controlled.  Stable.  Continue omeprazole 40 mg once a day. - omeprazole (PRILOSEC) 40 MG capsule; TAKE ONE (1) CAPSULE EACH DAY.  Dispense: 90 capsule; Refill: 1  4. Influenza vaccine needed Discussed and administered. - Flu Vaccine QUAD 6+ mos PF IM (Fluarix Quad PF)

## 2019-09-12 LAB — RENAL FUNCTION PANEL
Albumin: 4.4 g/dL (ref 3.8–4.8)
BUN/Creatinine Ratio: 12 (ref 12–28)
BUN: 10 mg/dL (ref 8–27)
CO2: 26 mmol/L (ref 20–29)
Calcium: 9.2 mg/dL (ref 8.7–10.3)
Chloride: 102 mmol/L (ref 96–106)
Creatinine, Ser: 0.84 mg/dL (ref 0.57–1.00)
GFR calc Af Amer: 86 mL/min/{1.73_m2} (ref >59)
GFR calc non Af Amer: 75 mL/min/{1.73_m2} (ref >59)
Glucose: 96 mg/dL (ref 65–99)
Phosphorus: 3.7 mg/dL (ref 3.0–4.3)
Potassium: 4.3 mmol/L (ref 3.5–5.2)
Sodium: 142 mmol/L (ref 134–144)

## 2019-09-12 LAB — LIPID PANEL
Chol/HDL Ratio: 4 ratio (ref 0.0–4.4)
Cholesterol, Total: 190 mg/dL (ref 100–199)
HDL: 47 mg/dL (ref >39)
LDL Chol Calc (NIH): 111 mg/dL — ABNORMAL HIGH (ref 0–99)
Triglycerides: 184 mg/dL — ABNORMAL HIGH (ref 0–149)
VLDL Cholesterol Cal: 32 mg/dL (ref 5–40)

## 2019-09-12 LAB — TSH: TSH: 2.05 u[IU]/mL (ref 0.450–4.500)

## 2020-01-14 ENCOUNTER — Other Ambulatory Visit (HOSPITAL_COMMUNITY)
Admission: RE | Admit: 2020-01-14 | Discharge: 2020-01-14 | Disposition: A | Discharge status: Home / Self Care | Payer: Managed Care, Other (non HMO) | Source: Ambulatory Visit | Attending: Obstetrics and Gynecology | Admitting: Obstetrics and Gynecology

## 2020-01-14 ENCOUNTER — Other Ambulatory Visit: Payer: Self-pay

## 2020-01-14 ENCOUNTER — Ambulatory Visit (INDEPENDENT_AMBULATORY_CARE_PROVIDER_SITE_OTHER): Payer: Managed Care, Other (non HMO) | Admitting: Obstetrics and Gynecology

## 2020-01-14 ENCOUNTER — Encounter: Payer: Self-pay | Admitting: Obstetrics and Gynecology

## 2020-01-14 VITALS — BP 142/90 | Ht 64.0 in | Wt 215.0 lb

## 2020-01-14 DIAGNOSIS — Z124 Encounter for screening for malignant neoplasm of cervix: Secondary | ICD-10-CM | POA: Diagnosis not present

## 2020-01-14 DIAGNOSIS — Z1231 Encounter for screening mammogram for malignant neoplasm of breast: Secondary | ICD-10-CM

## 2020-01-14 DIAGNOSIS — M4004 Postural kyphosis, thoracic region: Secondary | ICD-10-CM

## 2020-01-14 DIAGNOSIS — Z0000 Encounter for general adult medical examination without abnormal findings: Secondary | ICD-10-CM

## 2020-01-14 DIAGNOSIS — Z01419 Encounter for gynecological examination (general) (routine) without abnormal findings: Secondary | ICD-10-CM | POA: Diagnosis not present

## 2020-01-14 DIAGNOSIS — R030 Elevated blood-pressure reading, without diagnosis of hypertension: Secondary | ICD-10-CM

## 2020-01-14 DIAGNOSIS — Z13820 Encounter for screening for osteoporosis: Secondary | ICD-10-CM

## 2020-01-14 DIAGNOSIS — N898 Other specified noninflammatory disorders of vagina: Secondary | ICD-10-CM | POA: Diagnosis not present

## 2020-01-14 NOTE — Patient Instructions (Signed)
[employer] Recommended Dietary Allowances for Calcium and Vitamin D  Age (yr) Calcium Recommended Dietary Allowance (mg/day) Vitamin D Recommended Dietary Allowance (international units/day)  9-18 1,300 600  19-50 1,000 600  51-70 1,200 600  71 and older 1,200 800  Data from [employer]. Dietary reference intakes: calcium, vitamin D. [place], [place]: [employer] Academies [employer]; 2011.     Exercising to Stay Healthy To become healthy and stay healthy, it is recommended that you do moderate-intensity and vigorous-intensity exercise. You can tell that you are exercising at a moderate intensity if your heart starts beating faster and you start breathing faster but can still hold a conversation. You can tell that you are exercising at a vigorous intensity if you are breathing much harder and faster and cannot hold a conversation while exercising. Exercising regularly is important. It has many health benefits, such as:  Improving overall fitness, flexibility, and endurance.  Increasing bone density.  Helping with weight control.  Decreasing body fat.  Increasing muscle strength.  Reducing stress and tension.  Improving overall health. How often should I exercise? Choose an activity that you enjoy, and set realistic goals. Your health care provider can help you make an activity plan that works for you. Exercise regularly as told by your health care provider. This may include:  Doing strength training two times a week, such as: ? Lifting weights. ? Using resistance bands. ? Push-ups. ? Sit-ups. ? Yoga.  Doing a certain intensity of exercise for a given amount of time. Choose from these options: ? A total of 150 minutes of moderate-intensity exercise every week. ? A total of 75 minutes of vigorous-intensity exercise every week. ? A mix of moderate-intensity and vigorous-intensity exercise every week. Children, pregnant women, people who have not exercised  regularly, people who are overweight, and older adults may need to talk with a health care provider about what activities are safe to do. If you have a medical condition, be sure to talk with your health care provider before you start a new exercise program. What are some exercise ideas? Moderate-intensity exercise ideas include:  Walking 1 mile (1.6 km) in about 15 minutes.  Biking.  Hiking.  Golfing.  Dancing.  Water aerobics. Vigorous-intensity exercise ideas include:  Walking 4.5 miles (7.2 km) or more in about 1 hour.  Jogging or running 5 miles (8 km) in about 1 hour.  Biking 10 miles (16.1 km) or more in about 1 hour.  Lap swimming.  Roller-skating or in-line skating.  Cross-country skiing.  Vigorous competitive sports, such as football, basketball, and soccer.  Jumping rope.  Aerobic dancing. What are some everyday activities that can help me to get exercise?  Yard work, such as: ? Pushing a lawn mower. ? Raking and bagging leaves.  Washing your car.  Pushing a stroller.  Shoveling snow.  Gardening.  Washing windows or floors. How can I be more active in my day-to-day activities?  Use stairs instead of an elevator.  Take a walk during your lunch break.  If you drive, park your car farther away from your work or school.  If you take public transportation, get off one stop early and walk the rest of the way.  Stand up or walk around during all of your indoor phone calls.  Get up, stretch, and walk around every 30 minutes throughout the day.  Enjoy exercise with a friend. Support to continue exercising will help you keep a regular routine of activity. What guidelines can   I follow while exercising?  Before you start a new exercise program, talk with your health care provider.  Do not exercise so much that you hurt yourself, feel dizzy, or get very short of breath.  Wear comfortable clothes and wear shoes with good support.  Drink plenty of  water while you exercise to prevent dehydration or heat stroke.  Work out until your breathing and your heartbeat get faster. Where to find more information  [place] Department of Health and Human Services: www.hhs.gov  Centers for Disease Control and Prevention (CDC): www.cdc.gov Summary  Exercising regularly is important. It will improve your overall fitness, flexibility, and endurance.  Regular exercise also will improve your overall health. It can help you control your weight, reduce stress, and improve your bone density.  Do not exercise so much that you hurt yourself, feel dizzy, or get very short of breath.  Before you start a new exercise program, talk with your health care provider. This information is not intended to replace advice given to you by your health care provider. Make sure you discuss any questions you have with your health care provider. Document Revised: 09/27/2017 Document Reviewed: 09/05/2017 Elsevier Patient Education  2020 [employer]   Budget-Friendly Healthy Eating There are many ways to save money at the grocery store and continue to eat healthy. You can be successful if you:  Plan meals according to your budget.  Make a grocery list and only purchase food according to your grocery list.  Prepare food yourself. What are tips for following this plan?  Reading food labels  Compare food labels between brand name foods and the store brand. Often the nutritional value is the same, but the store brand is lower cost.  Look for products that do not have added sugar, fat, or salt (sodium). These often cost the same but are healthier for you. Products may be labeled as: ? Sugar-free. ? Nonfat. ? Low-fat. ? Sodium-free. ? Low-sodium.  Look for lean ground beef labeled as at least 92% lean and 8% fat. Shopping  Buy only the items on your grocery list and go only to the areas of the store that have the items on your list.  Use coupons only for foods  and brands you normally buy. Avoid buying items you wouldn't normally buy simply because they are on sale.  Check online and in newspapers for weekly deals.  Buy healthy items from the bulk bins when available, such as herbs, spices, flour, pasta, nuts, and dried fruit.  Buy fruits and vegetables that are in season. Prices are usually lower on in-season produce.  Look at the unit price on the price tag. Use it to compare different brands and sizes to find out which item is the best deal.  Choose healthy items that are often low-cost, such as carrots, potatoes, apples, bananas, and oranges. Dried or canned beans are a low-cost protein source.  Buy in bulk and freeze extra food. Items you can buy in bulk include meats, fish, poultry, frozen fruits, and frozen vegetables.  Avoid buying "ready-to-eat" foods, such as pre-cut fruits and vegetables and pre-made salads.  If possible, shop around to discover where you can find the best prices. Consider other retailers such as dollar stores, larger wholesale stores, local fruit and vegetable stands, and farmers markets.  Do not shop when you are hungry. If you shop while hungry, it may be hard to stick to your list and budget.  Resist impulse buying. Use your grocery list as   your official plan for the week.  Buy a variety of vegetables and fruits by purchasing fresh, frozen, and canned items.  Look at the top and bottom shelves for deals. Foods at eye level (eye level of an adult or child) are usually more expensive.  Be efficient with your time when shopping. The more time you spend at the store, the more money you are likely to spend.  To save money when choosing more expensive foods like meats and dairy: ? Choose cheaper cuts of meat, such as bone-in chicken thighs and drumsticks instead of skinless and boneless chicken. When you are ready to prepare the chicken, you can remove the skin yourself to make it healthier. ? Choose lean meats like  chicken or turkey instead of beef. ? Choose canned seafood, such as tuna, salmon, or sardines. ? Buy eggs as a low-cost source of protein. ? Buy dried beans and peas, such as lentils, split peas, or kidney beans instead of meats. Dried beans and peas are a good alternative source of protein. ? Buy the larger tubs of yogurt instead of individual-sized containers.  Choose water instead of sodas and other sweetened beverages.  Avoid buying chips, cookies, and other "junk food." These items are usually expensive and not healthy. Cooking  Make extra food and freeze the extras in meal-sized containers or in individual portions for fast meals and snacks.  Pre-cook on days when you have extra time to prepare meals in advance. You can keep these meals in the fridge or freezer and reheat for a quick meal.  When you come home from the grocery store, wash, peel, and cut fruits and vegetables so they are ready to use and eat. This will help reduce food waste. Meal planning  Do not eat out or get fast food. Prepare food at home.  Make a grocery list and make sure to bring it with you to the store. If you have a smart phone, you could use your phone to create your shopping list.  Plan meals and snacks according to a grocery list and budget you create.  Use leftovers in your meal plan for the week.  Look for recipes where you can cook once and make enough food for two meals.  Include budget-friendly meals like stews, casseroles, and stir-fry dishes.  Try some meatless meals or try "no cook" meals like salads.  Make sure that half your plate is filled with fruits or vegetables. Choose from fresh, frozen, or canned fruits and vegetables. If eating canned, remember to rinse them before eating. This will remove any excess salt added for packaging. Summary  Eating healthy on a budget is possible if you plan your meals according to your budget, purchase according to your budget and grocery list, and  prepare food yourself.  Tips for buying more food on a limited budget include buying generic brands, using coupons only for foods you normally buy, and buying healthy items from the bulk bins when available.  Tips for buying cheaper food to replace expensive food include choosing cheaper, lean cuts of meat, and buying dried beans and peas. This information is not intended to replace advice given to you by your health care provider. Make sure you discuss any questions you have with your health care provider. Document Revised: 10/16/2017 Document Reviewed: 10/16/2017 Elsevier Patient Education  2020 [employer].   Bone Health Bones protect organs, store calcium, anchor muscles, and support the whole body. Keeping your bones strong is important, especially as you   get older. You can take actions to help keep your bones strong and healthy. Why is keeping my bones healthy important?  Keeping your bones healthy is important because your body constantly replaces bone cells. Cells get old, and new cells take their place. As we age, we lose bone cells because the body may not be able to make enough new cells to replace the old cells. The amount of bone cells and bone tissue you have is referred to as bone mass. The higher your bone mass, the stronger your bones. The aging process leads to an overall loss of bone mass in the body, which can increase the likelihood of:  Joint pain and stiffness.  Broken bones.  A condition in which the bones become weak and brittle (osteoporosis). A large decline in bone mass occurs in older adults. In women, it occurs about the time of menopause. What actions can I take to keep my bones healthy? Good health habits are important for maintaining healthy bones. This includes eating nutritious foods and exercising regularly. To have healthy bones, you need to get enough of the right minerals and vitamins. Most nutrition experts recommend getting these nutrients from the  foods that you eat. In some cases, taking supplements may also be recommended. Doing certain types of exercise is also important for bone health. What are the nutritional recommendations for healthy bones?  Eating a well-balanced diet with plenty of calcium and vitamin D will help to protect your bones. Nutritional recommendations vary from person to person. Ask your health care provider what is healthy for you. Here are some general guidelines. Get enough calcium Calcium is the most important (essential) mineral for bone health. Most people can get enough calcium from their diet, but supplements may be recommended for people who are at risk for osteoporosis. Good sources of calcium include:  Dairy products, such as low-fat or nonfat milk, cheese, and yogurt.  Dark green leafy vegetables, such as bok choy and broccoli.  Calcium-fortified foods, such as orange juice, cereal, bread, soy beverages, and tofu products.  Nuts, such as almonds. Follow these recommended amounts for daily calcium intake:  Children, age 1-3: 700 mg.  Children, age 4-8: 1,000 mg.  Children, age 9-13: 1,300 mg.  Teens, age 14-18: 1,300 mg.  Adults, age 19-50: 1,000 mg.  Adults, age 51-70: ? Men: 1,000 mg. ? Women: 1,200 mg.  Adults, age 71 or older: 1,200 mg.  Pregnant and breastfeeding females: ? Teens: 1,300 mg. ? Adults: 1,000 mg. Get enough vitamin D Vitamin D is the most essential vitamin for bone health. It helps the body absorb calcium. Sunlight stimulates the skin to make vitamin D, so be sure to get enough sunlight. If you live in a cold climate or you do not get outside often, your health care provider may recommend that you take vitamin D supplements. Good sources of vitamin D in your diet include:  Egg yolks.  Saltwater fish.  Milk and cereal fortified with vitamin D. Follow these recommended amounts for daily vitamin D intake:  Children and teens, age 1-18: 600 international  units.  Adults, age 50 or younger: 400-800 international units.  Adults, age 51 or older: 800-1,000 international units. Get other important nutrients Other nutrients that are important for bone health include:  Phosphorus. This mineral is found in meat, poultry, dairy foods, nuts, and legumes. The recommended daily intake for adult men and adult women is 700 mg.  Magnesium. This mineral is found in seeds, nuts, dark   green vegetables, and legumes. The recommended daily intake for adult men is 400-420 mg. For adult women, it is 310-320 mg.  Vitamin K. This vitamin is found in green leafy vegetables. The recommended daily intake is 120 mg for adult men and 90 mg for adult women. What type of physical activity is best for building and maintaining healthy bones? Weight-bearing and strength-building activities are important for building and maintaining healthy bones. Weight-bearing activities cause muscles and bones to work against gravity. Strength-building activities increase the strength of the muscles that support bones. Weight-bearing and muscle-building activities include:  Walking and hiking.  Jogging and running.  Dancing.  Gym exercises.  Lifting weights.  Tennis and racquetball.  Climbing stairs.  Aerobics. Adults should get at least 30 minutes of moderate physical activity on most days. Children should get at least 60 minutes of moderate physical activity on most days. Ask your health care provider what type of exercise is best for you. How can I find out if my bone mass is low? Bone mass can be measured with an X-ray test called a bone mineral density (BMD) test. This test is recommended for all women who are age 65 or older. It may also be recommended for:  Men who are age 70 or older.  People who are at risk for osteoporosis because of: ? Having bones that break easily. ? Having a long-term disease that weakens bones, such as kidney disease or rheumatoid  arthritis. ? Having menopause earlier than normal. ? Taking medicine that weakens bones, such as steroids, thyroid hormones, or hormone treatment for breast cancer or prostate cancer. ? Smoking. ? Drinking three or more alcoholic drinks a day. If you find that you have a low bone mass, you may be able to prevent osteoporosis or further bone loss by changing your diet and lifestyle. Where can I find more information? For more information, check out the following websites:  National Osteoporosis Foundation: www.nof.org/patients  National Institutes of Health: www.bones.nih.gov  International Osteoporosis Foundation: www.iofbonehealth.org Summary  The aging process leads to an overall loss of bone mass in the body, which can increase the likelihood of broken bones and osteoporosis.  Eating a well-balanced diet with plenty of calcium and vitamin D will help to protect your bones.  Weight-bearing and strength-building activities are also important for building and maintaining strong bones.  Bone mass can be measured with an X-ray test called a bone mineral density (BMD) test. This information is not intended to replace advice given to you by your health care provider. Make sure you discuss any questions you have with your health care provider. Document Revised: 11/11/2017 Document Reviewed: 11/11/2017 Elsevier Patient Education  2020 [employer].   

## 2020-01-14 NOTE — Progress Notes (Signed)
Gynecology Annual Exam  PCP: Duanne Limerick, MD  Chief Complaint:  Chief Complaint  Patient presents with  . Gynecologic Exam    Vaginal burning, and dryness    History of Present Illness:Patient is a 64 y.o. G1P1001 presents for annual exam. The patient has no complaints today.   LMP: No LMP recorded. Patient is postmenopausal. Denies postmenopausal bleeding  The patient does perform self breast exams.  There is no notable family history of breast or ovarian cancer in her family.  The patient denies current symptoms of depression.     Review of Systems: ROS  Past Medical History:  Patient Active Problem List   Diagnosis Date Noted  . 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 Abnormal carotid pulse 08/27/2017  . History of colon polyps   . Rectal polyp   . Esophageal dysphagia   . Gastritis without bleeding   . Tremor 06/11/2016  . GERD (gastroesophageal reflux disease) 06/11/2016  . Hyperlipidemia 06/11/2016  . Fever blister 06/11/2016  . Thyroid activity decreased 06/11/2016  . EPIGASTRIC PAIN 04/20/2009    Qualifier: Diagnosis of  By: Melvyn Neth CMA (AAMA), Patty     . NONSPECIFIC ABN FINDING RAD & OTH EXAM GI TRACT 04/20/2009    Qualifier: Diagnosis of  By: Melvyn Neth CMA (AAMA), Patty       Past Surgical History:  Past Surgical History:  Procedure Laterality Date  . ADENOIDECTOMY    . COLONOSCOPY  2008   [place]  . COLONOSCOPY WITH PROPOFOL N/A 07/29/2017   Procedure: COLONOSCOPY WITH PROPOFOL;  Surgeon: Midge Minium, MD;  Location: [facility];  Service: Gastroenterology;  Laterality: N/A;  . ESOPHAGOGASTRODUODENOSCOPY (EGD) WITH PROPOFOL N/A 07/29/2017   Procedure: ESOPHAGOGASTRODUODENOSCOPY (EGD) WITH PROPOFOL;  Surgeon: Midge Minium, MD;  Location: [facility];  Service: Gastroenterology;  Laterality: N/A;  requests early  . POLYPECTOMY  07/29/2017   Procedure: POLYPECTOMY;  Surgeon: Midge Minium, MD;   Location: [facility];  Service: Gastroenterology;;    Gynecologic History:  No LMP recorded. Patient is postmenopausal. Last Pap: Results were: unknown, normal per patient  Last mammogram: 2021 [facility]  Obstetric History: G1P1001  Family History:  Family History  Problem Relation Age of Onset  . Cancer Father   . Heart disease Father   . Cancer Brother   . Diabetes Paternal Grandmother   . Lung cancer Sister     Social History:  Social History   Socioeconomic History  . Marital status: Married    Spouse name: Not on file  . Number of children: Not on file  . Years of education: Not on file  . Highest education level: Not on file  Occupational History  . Not on file  Tobacco Use  . Smoking status: Never Smoker  . Smokeless tobacco: Never Used  Substance and Sexual Activity  . Alcohol use: No  . Drug use: No  . Sexual activity: Not Currently    Birth control/protection: Post-menopausal  Other Topics Concern  . Not on file  Social History Narrative  . Not on file   Social Determinants of Health   Financial Resource Strain:   . Difficulty of Paying Living Expenses:   Food Insecurity:   . Worried About Programme researcher, broadcasting/film/video in the Last Year:   . Barista in the Last Year:   Transportation Needs:   . Freight forwarder (Medical):   Marland Kitchen Lack of Transportation (Non-Medical):  Physical Activity:   . Days of Exercise per Week:   . Minutes of Exercise per Session:   Stress:   . Feeling of Stress :   Social Connections:   . Frequency of Communication with Friends and Family:   . Frequency of Social Gatherings with Friends and Family:   . Attends Religious Services:   . Active Member of Clubs or Organizations:   . Attends Banker Meetings:   Marland Kitchen Marital Status:   Intimate Partner Violence:   . Fear of Current or Ex-Partner:   . Emotionally Abused:   Marland Kitchen Physically Abused:   . Sexually Abused:     Allergies:  No Known  Allergies  Medications: Prior to Admission medications   Medication Sig Start Date End Date Taking? Authorizing Provider  atorvastatin (LIPITOR) 10 MG tablet TAKE (1) TABLET BY MOUTH DAILY AT BEDTIME 09/11/19  Yes Duanne Limerick, MD  levothyroxine (SYNTHROID) 125 MCG tablet TAKE (1) TABLET BY MOUTH EVERY DAY 09/11/19  Yes Duanne Limerick, MD  omeprazole (PRILOSEC) 40 MG capsule TAKE ONE (1) CAPSULE EACH DAY. 09/11/19  Yes Duanne Limerick, MD  propranolol (INDERAL) 80 MG tablet Take 1 tablet (80 mg total) by mouth 2 (two) times daily. Dr Sherryll Burger 02/05/19  Yes Duanne Limerick, MD  valACYclovir (VALTREX) 500 MG tablet TAKE (1) TABLET BY MOUTH TWICE DAILY AS NEEDED FOR FLARE UP 02/05/19  Yes Duanne Limerick, MD    Physical Exam Vitals: Blood pressure (!) 142/90, height 5\' 4"  (1.626 m), weight 215 lb (97.5 kg).  General: NAD HEENT: normocephalic, anicteric Thyroid: no enlargement, no palpable nodules Pulmonary: No increased work of breathing, CTAB Cardiovascular: RRR, distal pulses 2+ Breast: Breast symmetrical, no tenderness, no palpable nodules or masses, no skin or nipple retraction present, no nipple discharge.  No axillary or supraclavicular lymphadenopathy. Abdomen: NABS, soft, non-tender, non-distended.  Umbilicus without lesions.  No hepatomegaly, splenomegaly or masses palpable. No evidence of hernia  Genitourinary:  External: Normal external female genitalia.  Normal urethral meatus, normal Bartholin's and Skene's glands.    Vagina: Normal vaginal mucosa, no evidence of prolapse.    Cervix: Grossly normal in appearance, no bleeding  Uterus: Non-enlarged, mobile, normal contour.  No CMT  Adnexa: ovaries non-enlarged, no adnexal masses  Rectal: deferred  Lymphatic: no evidence of inguinal lymphadenopathy Extremities: no edema, erythema, or tenderness Neurologic: Grossly intact Psychiatric: mood appropriate, affect full  Female chaperone present for pelvic and breast  portions of the  physical exam   Assessment: 64 y.o. G1P1001 routine annual exam  Plan: Problem List Items Addressed This Visit    None    Visit Diagnoses    Postural kyphosis of thoracic region    -  Primary   Relevant Orders   DG BONE DENSITY (DXA)   Screening for osteoporosis       Relevant Orders   DG BONE DENSITY (DXA)   Healthcare maintenance       Screening for cervical cancer       Relevant Orders   Cytology - PAP   Encounter for screening mammogram for malignant neoplasm of breast       Relevant Orders   MM DIGITAL SCREENING BILATERAL      1) Mammogram - recommend yearly screening mammogram.  Mammogram Was ordered today  2) STI screening  was not offered and therefore not obtained  3) ASCCP guidelines and rational discussed.  Patient opts for every 5 years screening interval  4) Osteoporosis  -  per USPTF routine screening DEXA at age 55 - FRAX 53 year major fracture risk 16%,  10 year hip fracture risk 1.1%  Consider FDA-approved medical therapies in postmenopausal women and men aged 74 years and older, based on the following: a) A hip or vertebral (clinical or morphometric) fracture b) T-score ? -2.5 at the femoral neck or spine after appropriate evaluation to exclude secondary causes C) Low bone mass (T-score between -1.0 and -2.5 at the femoral neck or spine) and a 10-year probability of a hip fracture ? 3% or a 10-year probability of a major osteoporosis-related fracture ? 20% based on the [place]-adapted WHO algorithm  5) Routine healthcare maintenance including cholesterol, diabetes screening discussed managed by PCP  6) Colonoscopy 07/29/2017, repeat in 5 years advised  7) Return in about 1 year (around 01/13/2021) for annual.  Adrian Prows MD [place] 01/14/2020 3:09 PM

## 2020-01-18 LAB — CYTOLOGY - PAP
Comment: NEGATIVE
Diagnosis: NEGATIVE
High risk HPV: NEGATIVE

## 2020-01-18 NOTE — Progress Notes (Signed)
Please let patient know about normal pap smear result.

## 2020-01-22 NOTE — Progress Notes (Signed)
Pt aware.

## 2020-01-22 NOTE — Progress Notes (Signed)
LVM for pt to call back for results.

## 2020-01-26 ENCOUNTER — Other Ambulatory Visit: Payer: Self-pay

## 2020-01-26 ENCOUNTER — Ambulatory Visit
Admission: RE | Admit: 2020-01-26 | Discharge: 2020-01-26 | Disposition: A | Discharge status: Home / Self Care | Payer: Managed Care, Other (non HMO) | Source: Ambulatory Visit | Attending: Obstetrics and Gynecology | Admitting: Obstetrics and Gynecology

## 2020-01-26 DIAGNOSIS — Z1231 Encounter for screening mammogram for malignant neoplasm of breast: Secondary | ICD-10-CM | POA: Insufficient documentation

## 2020-03-21 ENCOUNTER — Other Ambulatory Visit: Payer: Self-pay | Admitting: Family Medicine

## 2020-03-21 DIAGNOSIS — E782 Mixed hyperlipidemia: Secondary | ICD-10-CM

## 2020-03-21 NOTE — Telephone Encounter (Signed)
Requested Prescriptions  Pending Prescriptions Disp Refills  . atorvastatin (LIPITOR) 10 MG tablet [Pharmacy Med Name: ATORVASTATIN CALCIUM 10 MG TAB] 90 tablet 1    Sig: TAKE (1) TABLET BY MOUTH DAILY AT BEDTIME     Cardiovascular:  Antilipid - Statins Failed - 03/21/2020 10:29 AM      Failed - LDL in normal range and within 360 days    LDL Chol Calc (NIH)  Date Value Ref Range Status  09/11/2019 111 (H) 0 - 99 mg/dL Final         Failed - Triglycerides in normal range and within 360 days    Triglycerides  Date Value Ref Range Status  09/11/2019 184 (H) 0 - 149 mg/dL Final         Passed - Total Cholesterol in normal range and within 360 days    Cholesterol, Total  Date Value Ref Range Status  09/11/2019 190 100 - 199 mg/dL Final         Passed - HDL in normal range and within 360 days    HDL  Date Value Ref Range Status  09/11/2019 47 >39 mg/dL Final         Passed - Patient is not pregnant      Passed - Valid encounter within last 12 months    Recent Outpatient Visits          6 months ago Hypothyroidism, unspecified type   [facility] Duanne Limerick, MD   1 year ago Gastroesophageal reflux disease, esophagitis presence not specified   [facility] Duanne Limerick, MD   2 years ago Hypothyroidism, unspecified type   [facility] Duanne Limerick, MD   2 years ago Pulsatile neck mass   [facility] Duanne Limerick, MD   2 years ago Dystonic tremor   [facility] Duanne Limerick, MD

## 2020-04-19 ENCOUNTER — Other Ambulatory Visit: Payer: Self-pay | Admitting: Family Medicine

## 2020-04-19 DIAGNOSIS — E039 Hypothyroidism, unspecified: Secondary | ICD-10-CM

## 2020-06-27 ENCOUNTER — Other Ambulatory Visit: Payer: Self-pay | Admitting: Family Medicine

## 2020-06-27 DIAGNOSIS — E039 Hypothyroidism, unspecified: Secondary | ICD-10-CM

## 2020-08-23 ENCOUNTER — Other Ambulatory Visit: Payer: Self-pay | Admitting: Family Medicine

## 2020-08-23 DIAGNOSIS — E782 Mixed hyperlipidemia: Secondary | ICD-10-CM

## 2020-08-31 ENCOUNTER — Other Ambulatory Visit: Payer: Self-pay | Admitting: Family Medicine

## 2020-08-31 DIAGNOSIS — E782 Mixed hyperlipidemia: Secondary | ICD-10-CM

## 2020-10-17 ENCOUNTER — Other Ambulatory Visit: Payer: Self-pay | Admitting: Family Medicine

## 2020-10-17 DIAGNOSIS — E039 Hypothyroidism, unspecified: Secondary | ICD-10-CM

## 2020-10-17 NOTE — Telephone Encounter (Signed)
Call to patient to schedule appointment- left message with female for patient to call back- dosage requested is different than dose in medication list.

## 2020-10-26 ENCOUNTER — Other Ambulatory Visit: Payer: Self-pay | Admitting: Family Medicine

## 2020-10-26 DIAGNOSIS — E039 Hypothyroidism, unspecified: Secondary | ICD-10-CM

## 2020-10-27 ENCOUNTER — Encounter: Payer: Self-pay | Admitting: Family Medicine

## 2020-10-27 ENCOUNTER — Other Ambulatory Visit: Payer: Self-pay

## 2020-10-27 ENCOUNTER — Ambulatory Visit (INDEPENDENT_AMBULATORY_CARE_PROVIDER_SITE_OTHER): Payer: Managed Care, Other (non HMO) | Admitting: Family Medicine

## 2020-10-27 VITALS — BP 124/80 | HR 60 | Ht 65.0 in | Wt 211.0 lb

## 2020-10-27 DIAGNOSIS — E782 Mixed hyperlipidemia: Secondary | ICD-10-CM

## 2020-10-27 DIAGNOSIS — E039 Hypothyroidism, unspecified: Secondary | ICD-10-CM

## 2020-10-27 DIAGNOSIS — R69 Illness, unspecified: Secondary | ICD-10-CM

## 2020-10-27 DIAGNOSIS — K219 Gastro-esophageal reflux disease without esophagitis: Secondary | ICD-10-CM

## 2020-10-27 DIAGNOSIS — K120 Recurrent oral aphthae: Secondary | ICD-10-CM

## 2020-10-27 DIAGNOSIS — R251 Tremor, unspecified: Secondary | ICD-10-CM

## 2020-10-27 MED ORDER — PROPRANOLOL HCL 80 MG PO TABS: 80.0000 mg | ORAL_TABLET | Freq: Two times a day (BID) | ORAL | 1 refills | Status: DC

## 2020-10-27 MED ORDER — OMEPRAZOLE 40 MG PO CPDR: DELAYED_RELEASE_CAPSULE | ORAL | 1 refills | Status: DC

## 2020-10-27 MED ORDER — ATORVASTATIN CALCIUM 10 MG PO TABS: ORAL_TABLET | ORAL | 1 refills | Status: DC

## 2020-10-27 MED ORDER — LEVOTHYROXINE SODIUM 125 MCG PO TABS: ORAL_TABLET | ORAL | 1 refills | Status: DC

## 2020-10-27 MED ORDER — VALACYCLOVIR HCL 500 MG PO TABS: ORAL_TABLET | ORAL | 5 refills | Status: DC

## 2020-10-27 NOTE — Progress Notes (Signed)
Date:  10/27/2020   Name:  Cheryl Campos   DOB:  04/05/1956   MRN:  [id number]   Chief Complaint: Hypothyroidism, Hyperlipidemia, and Gastroesophageal Reflux  Hyperlipidemia This is a chronic problem. The current episode started more than 1 year ago. The problem is controlled. Recent lipid tests were reviewed and are normal. She has no history of chronic renal disease, diabetes, hypothyroidism, liver disease, obesity or nephrotic syndrome. There are no known factors aggravating her hyperlipidemia. Pertinent negatives include no chest pain, focal sensory loss, focal weakness, leg pain, myalgias or shortness of breath. Current antihyperlipidemic treatment includes statins. The current treatment provides moderate improvement of lipids. There are no compliance problems.  There are no known risk factors for coronary artery disease.  Gastroesophageal Reflux She reports no abdominal pain, no belching, no chest pain, no choking, no coughing, no dysphagia, no early satiety, no globus sensation, no heartburn, no hoarse voice, no nausea, no sore throat, no stridor, no tooth decay, no water brash or no wheezing. This is a chronic problem. The current episode started more than 1 year ago. The problem has been gradually improving. The symptoms are aggravated by certain foods. Pertinent negatives include no anemia, fatigue, melena, muscle weakness, orthopnea or weight loss. She has tried a PPI for the symptoms. Past procedures do not include an abdominal ultrasound, an EGD, esophageal manometry, esophageal pH monitoring, H. pylori antibody titer or a UGI.  Thyroid Problem Presents for follow-up visit. Symptoms include palpitations. Patient reports no anxiety, cold intolerance, constipation, depressed mood, diaphoresis, diarrhea, dry skin, fatigue, hair loss, heat intolerance, hoarse voice, leg swelling, menstrual problem, nail problem, tremors, visual change, weight gain or weight loss. The symptoms have been  stable. Her past medical history is significant for hyperlipidemia. There is no history of diabetes.    Lab Results  Component Value Date   CREATININE 0.84 09/11/2019   BUN 10 09/11/2019   NA 142 09/11/2019   K 4.3 09/11/2019   CL 102 09/11/2019   CO2 26 09/11/2019   Lab Results  Component Value Date   CHOL 190 09/11/2019   HDL 47 09/11/2019   LDLCALC 111 (H) 09/11/2019   TRIG 184 (H) 09/11/2019   CHOLHDL 4.0 09/11/2019   Lab Results  Component Value Date   TSH 2.050 09/11/2019   No results found for: HGBA1C Lab Results  Component Value Date   WBC 12.4 (H) 03/14/2017   HGB 13.5 03/14/2017   HCT 41.8 03/14/2017   MCV 88.2 03/14/2017   PLT 232 03/14/2017   No results found for: ALT, AST, GGT, ALKPHOS, BILITOT   Review of Systems  Constitutional: Negative.  Negative for chills, diaphoresis, fatigue, fever, unexpected weight change, weight gain and weight loss.  HENT: Negative for congestion, ear discharge, ear pain, hoarse voice, rhinorrhea, sinus pressure, sneezing and sore throat.   Eyes: Negative for double vision, photophobia, pain, discharge, redness and itching.  Respiratory: Negative for cough, choking, shortness of breath, wheezing and stridor.   Cardiovascular: Positive for palpitations. Negative for chest pain.  Gastrointestinal: Negative for abdominal pain, blood in stool, constipation, diarrhea, dysphagia, heartburn, melena, nausea and vomiting.  Endocrine: Negative for cold intolerance, heat intolerance, polydipsia, polyphagia and polyuria.  Genitourinary: Negative for dysuria, flank pain, frequency, hematuria, menstrual problem, pelvic pain, urgency, vaginal bleeding and vaginal discharge.  Musculoskeletal: Negative for arthralgias, back pain, myalgias and muscle weakness.  Skin: Negative for rash.  Allergic/Immunologic: Negative for environmental allergies and food allergies.  Neurological: Negative for  dizziness, tremors, focal weakness, weakness,  light-headedness, numbness and headaches.  Hematological: Negative for adenopathy. Does not bruise/bleed easily.  Psychiatric/Behavioral: Negative for dysphoric mood. The patient is not nervous/anxious.     Patient Active Problem List   Diagnosis Date Noted  . 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 Abnormal carotid pulse 08/27/2017  . History of colon polyps   . Rectal polyp   . Esophageal dysphagia   . Gastritis without bleeding   . Tremor 06/11/2016  . GERD (gastroesophageal reflux disease) 06/11/2016  . Hyperlipidemia 06/11/2016  . Fever blister 06/11/2016  . Thyroid activity decreased 06/11/2016  . EPIGASTRIC PAIN 04/20/2009  . NONSPECIFIC ABN FINDING RAD & OTH EXAM GI TRACT 04/20/2009    No Known Allergies  Past Surgical History:  Procedure Laterality Date  . ADENOIDECTOMY    . COLONOSCOPY  2008   [place]  . COLONOSCOPY WITH PROPOFOL N/A 07/29/2017   Procedure: COLONOSCOPY WITH PROPOFOL;  Surgeon: Midge Minium, MD;  Location: [facility];  Service: Gastroenterology;  Laterality: N/A;  . ESOPHAGOGASTRODUODENOSCOPY (EGD) WITH PROPOFOL N/A 07/29/2017   Procedure: ESOPHAGOGASTRODUODENOSCOPY (EGD) WITH PROPOFOL;  Surgeon: Midge Minium, MD;  Location: [facility];  Service: Gastroenterology;  Laterality: N/A;  requests early  . POLYPECTOMY  07/29/2017   Procedure: POLYPECTOMY;  Surgeon: Midge Minium, MD;  Location: [facility];  Service: Gastroenterology;;    Social History   Tobacco Use  . Smoking status: Never Smoker  . Smokeless tobacco: Never Used  Vaping Use  . Vaping Use: Never used  Substance Use Topics  . Alcohol use: No  . Drug use: No     Medication list has been reviewed and updated.  Current Meds  Medication Sig  . atorvastatin (LIPITOR) 10 MG tablet TAKE (1) TABLET BY MOUTH DAILY AT BEDTIME  . levothyroxine (SYNTHROID) 125 MCG tablet TAKE (1) TABLET BY MOUTH EVERY DAY  . omeprazole (PRILOSEC) 40 MG capsule TAKE ONE (1) CAPSULE  EACH DAY.  Marland Kitchen propranolol (INDERAL) 80 MG tablet Take 1 tablet (80 mg total) by mouth 2 (two) times daily. Dr Sherryll Burger  . valACYclovir (VALTREX) 500 MG tablet TAKE (1) TABLET BY MOUTH TWICE DAILY AS NEEDED FOR FLARE UP    PHQ 2/9 Scores 10/27/2020 02/05/2019 05/30/2017 05/30/2017  PHQ - 2 Score 0 0 0 0  PHQ- 9 Score 0 1 0 -    GAD 7 : Generalized Anxiety Score 10/27/2020  Nervous, Anxious, on Edge 0  Control/stop worrying 0  Worry too much - different things 0  Trouble relaxing 0  Restless 0  Easily annoyed or irritable 0  Afraid - awful might happen 0  Total GAD 7 Score 0    BP Readings from Last 3 Encounters:  10/27/20 124/80  01/14/20 (!) 142/90  09/11/19 130/70    Physical Exam Vitals and nursing note reviewed.  Constitutional:      Appearance: She is well-developed and well-nourished.  HENT:     Head: Normocephalic.     Right Ear: Tympanic membrane and external ear normal.     Left Ear: Tympanic membrane and external ear normal.     Nose: Nose normal.     Mouth/Throat:     Mouth: Oropharynx is clear and moist. Mucous membranes are moist.  Eyes:     General: Lids are everted, no foreign bodies appreciated. No scleral icterus.       Left eye: No foreign body or hordeolum.     Extraocular Movements: EOM normal.     Conjunctiva/sclera:  Conjunctivae normal.     Right eye: Right conjunctiva is not injected.     Left eye: Left conjunctiva is not injected.     Pupils: Pupils are equal, round, and reactive to light.  Neck:     Thyroid: No thyromegaly.     Vascular: No JVD.     Trachea: No tracheal deviation.  Cardiovascular:     Rate and Rhythm: Normal rate and regular rhythm.     Pulses: Intact distal pulses.     Heart sounds: Normal heart sounds. No murmur heard. No friction rub. No gallop.   Pulmonary:     Effort: Pulmonary effort is normal. No respiratory distress.     Breath sounds: Normal breath sounds. No wheezing, rhonchi or rales.  Chest:     Chest wall: No  tenderness.  Abdominal:     General: Bowel sounds are normal.     Palpations: Abdomen is soft. There is no hepatosplenomegaly or mass.     Tenderness: There is no abdominal tenderness. There is no right CVA tenderness, left CVA tenderness, guarding or rebound.     Hernia: No hernia is present.  Musculoskeletal:        General: No tenderness or edema. Normal range of motion.     Cervical back: Normal range of motion and neck supple.  Lymphadenopathy:     Cervical: No cervical adenopathy.  Skin:    General: Skin is warm.     Findings: No rash.  Neurological:     Mental Status: She is alert.     Cranial Nerves: No cranial nerve deficit.     Deep Tendon Reflexes: Strength normal. Reflexes normal.  Psychiatric:        Mood and Affect: Mood and affect normal. Mood is not anxious or depressed.     Wt Readings from Last 3 Encounters:  10/27/20 211 lb (95.7 kg)  01/14/20 215 lb (97.5 kg)  09/11/19 212 lb (96.2 kg)    BP 124/80   Pulse 60   Ht 5\' 5"  (1.651 m)   Wt 211 lb (95.7 kg)   BMI 35.11 kg/m   Assessment and Plan: 1. Hypothyroidism, unspecified type Chronic.  Controlled.  Stable.  Will obtain TSH and pending will make adjustments or continue at present dosing of 125 mcg levothyroxine daily. - levothyroxine (SYNTHROID) 125 MCG tablet; One a day  Dispense: 90 tablet; Refill: 1 - TSH  2. Gastroesophageal reflux disease, unspecified whether esophagitis present Chronic.  Controlled.  Stable.  Continue omeprazole 40 mg once a day. - omeprazole (PRILOSEC) 40 MG capsule; TAKE ONE (1) CAPSULE EACH DAY.  Dispense: 90 capsule; Refill: 1  3. Mixed hyperlipidemia Chronic.  Controlled.  Stable.  Continue atorvastatin 10 mg once a day.  Will check lipid panel and CMP for hepatic concerns. - atorvastatin (LIPITOR) 10 MG tablet; One a day  Dispense: 90 tablet; Refill: 1 - Lipid Panel With LDL/HDL Ratio - Comprehensive metabolic panel  4. Stomatitis herpetiformis Chronic.  Controlled.   Stable.  Currently stable but needs to have available when outbreaks occur. - valACYclovir (VALTREX) 500 MG tablet; TAKE (1) TABLET BY MOUTH TWICE DAILY AS NEEDED FOR FLARE UP  Dispense: 30 tablet; Refill: 5  5. Tremor Chronic.  Controlled.  Stable.  Continue propanolol 80 mg 1 tablet twice a day. - propranolol (INDERAL) 80 MG tablet; Take 1 tablet (80 mg total) by mouth 2 (two) times daily. Dr  Dispense: 180 tablet; Refill: 1  6. Taking medication for chronic  disease Patient on statin will check CMP to evaluate for hepatic concerns and electrolyte abnormalities. - Comprehensive metabolic panel  Patient had incidence of palpitations a week or 2 after receiving Pfizer vaccine.  Patient has concerns about completing her vaccination and I am we have discussed the necessity to sit down with someone of cardiology knowledge to determine whether or not it would be safe for her given the history of myocarditis and pericarditis with the Pfizer vaccine.  Patient is going to consider this and in the meantime we will give considerations as to a cardiology approach.

## 2020-10-28 LAB — COMPREHENSIVE METABOLIC PANEL
ALT: 16 IU/L (ref 0–32)
AST: 15 IU/L (ref 0–40)
Albumin/Globulin Ratio: 1.4 (ref 1.2–2.2)
Albumin: 4.2 g/dL (ref 3.8–4.8)
Alkaline Phosphatase: 115 IU/L (ref 44–121)
BUN/Creatinine Ratio: 17 (ref 12–28)
BUN: 14 mg/dL (ref 8–27)
Bilirubin Total: 0.4 mg/dL (ref 0.0–1.2)
CO2: 23 mmol/L (ref 20–29)
Calcium: 8.9 mg/dL (ref 8.7–10.3)
Chloride: 103 mmol/L (ref 96–106)
Creatinine, Ser: 0.84 mg/dL (ref 0.57–1.00)
GFR calc Af Amer: 86 mL/min/{1.73_m2} (ref >59)
GFR calc non Af Amer: 74 mL/min/{1.73_m2} (ref >59)
Globulin, Total: 2.9 g/dL (ref 1.5–4.5)
Glucose: 82 mg/dL (ref 65–99)
Potassium: 3.8 mmol/L (ref 3.5–5.2)
Sodium: 142 mmol/L (ref 134–144)
Total Protein: 7.1 g/dL (ref 6.0–8.5)

## 2020-10-28 LAB — LIPID PANEL WITH LDL/HDL RATIO
Cholesterol, Total: 170 mg/dL (ref 100–199)
HDL: 45 mg/dL (ref >39)
LDL Chol Calc (NIH): 93 mg/dL (ref 0–99)
LDL/HDL Ratio: 2.1 ratio (ref 0.0–3.2)
Triglycerides: 188 mg/dL — ABNORMAL HIGH (ref 0–149)
VLDL Cholesterol Cal: 32 mg/dL (ref 5–40)

## 2020-10-28 LAB — TSH: TSH: 1.69 u[IU]/mL (ref 0.450–4.500)

## 2020-12-15 ENCOUNTER — Ambulatory Visit: Payer: Self-pay | Admitting: *Deleted

## 2020-12-15 NOTE — Telephone Encounter (Signed)
I returned pt's call.  She was requesting general information regarding covid.   She started having symptoms Monday and did a home test which came up positive for covid.  I went over the care advice with her and the quarantine protocol, etc.   I answered her questions.  I advised her in regards to her husband.   She is staying upstairs and he is staying downstairs to avoid each other.  I let her know he needed to be tested if he starts having symptoms or 5-7 days after his first exposure to you.   He is fully vaccinated with both shots.  She does not have any high risks.   I did instruct her to call us back with additional questions or if she was not getting better over the next several days.  I instructed her to get medical attention if she develops shortness of breath or chest pain/tightness/discomfort.   She verbalized understanding.  I routed this information to Dr. Otilio Miu at [facility] for her information.     Reason for Disposition . [1] [id number] diagnosed by positive lab test (e.g., PCR, rapid self-test kit) AND [2] mild symptoms (e.g., cough, fever, others) AND [4] no complications or SOB  Answer Assessment - Initial Assessment Questions 1. COVID-19 DIAGNOSIS: "Who made your COVID-19 diagnosis?" "Was it confirmed by a positive lab test or self-test?" If not diagnosed by a doctor (or NP/PA), ask "Are there lots of cases (community spread) where you live?" Note: See public health department website, if unsure.     Positive covid.  Did a home test after symptoms started on Monday. 2. COVID-19 EXPOSURE: "Was there any known exposure to COVID before the symptoms began?" CDC Definition of close contact: within 6 feet (2 meters) for a total of 15 minutes or more over a 24-hour period.      Not sure where I got it from. 3. ONSET: "When did the COVID-19 symptoms start?"      Monday started  Having fever, chills, fatigue and a sore throat that started this morning.   Just a rare  occasional dry cough. 4. WORST SYMPTOM: "What is your worst symptom?" (e.g., cough, fever, shortness of breath, muscle aches)     Sore throat that started this moring 5. COUGH: "Do you have a cough?" If Yes, ask: "How bad is the cough?"       Rare dry cough 6. FEVER: "Do you have a fever?" If Yes, ask: "What is your temperature, how was it measured, and when did it start?"     Yes having chills 7. RESPIRATORY STATUS: "Describe your breathing?" (e.g., shortness of breath, wheezing, unable to speak)      Fine 8. BETTER-SAME-WORSE: "Are you getting better, staying the same or getting worse compared to yesterday?"  If getting worse, ask, "In what way?"     Same except I have a sore throat this morning 9. HIGH RISK DISEASE: "Do you have any chronic medical problems?" (e.g., asthma, heart or lung disease, weak immune system, obesity, etc.)     No 10. VACCINE: "Have you had the COVID-19 vaccine?" If Yes, ask: "Which one, how many shots, when did you get it?"       One of the [place] vaccine. 11. BOOSTER: "Have you received your COVID-19 booster?" If Yes, ask: "Which one and when did you get it?"       No  The first shot made me so sick I did not go back for  the 2nd one. 12. PREGNANCY: "Is there any chance you are pregnant?" "When was your last menstrual period?"       Not asked due to age 65. OTHER SYMPTOMS: "Do you have any other symptoms?"  (e.g., chills, fatigue, headache, loss of smell or taste, muscle pain, sore throat)       Chills,fatigue, sore throat 14. O2 SATURATION MONITOR:  "Do you use an oxygen saturation monitor (pulse oximeter) at home?" If Yes, ask "What is your reading (oxygen level) today?" "What is your usual oxygen saturation reading?" (e.g., 95%)       No  Protocols used: CORONAVIRUS (COVID-19) DIAGNOSED OR SUSPECTED-A-AH

## 2020-12-15 NOTE — Telephone Encounter (Signed)
Gave the do's and don'ts for COVID

## 2021-04-21 ENCOUNTER — Other Ambulatory Visit: Payer: Self-pay

## 2021-04-21 ENCOUNTER — Other Ambulatory Visit: Payer: Self-pay | Admitting: Family Medicine

## 2021-04-21 DIAGNOSIS — E039 Hypothyroidism, unspecified: Secondary | ICD-10-CM

## 2021-04-21 MED ORDER — LEVOTHYROXINE SODIUM 125 MCG PO TABS: ORAL_TABLET | ORAL | 0 refills | Status: DC

## 2021-04-21 NOTE — Telephone Encounter (Signed)
Could not reach patient to set up appointments

## 2021-04-21 NOTE — Telephone Encounter (Signed)
   Notes to clinic:  Patient had appointment scheduled for 05/05/2021 but it was canceled Review for refill     Requested Prescriptions  Pending Prescriptions Disp Refills   levothyroxine (SYNTHROID) 125 MCG tablet [Pharmacy Med Name: LEVOTHYROXINE SODIUM 125 MCG TAB] 90 tablet 1    Sig: TAKE (1) TABLET BY MOUTH EVERY DAY      Endocrinology:  Hypothyroid Agents Failed - 04/21/2021  1:06 PM      Failed - TSH needs to be rechecked within 3 months after an abnormal result. Refill until TSH is due.      Passed - TSH in normal range and within 360 days    TSH  Date Value Ref Range Status  10/27/2020 1.690 0.450 - 4.500 uIU/mL Final          Passed - Valid encounter within last 12 months    Recent Outpatient Visits           5 months ago Hypothyroidism, unspecified type   [facility] Duanne Limerick, MD   1 year ago Hypothyroidism, unspecified type   [facility] Duanne Limerick, MD   2 years ago Gastroesophageal reflux disease, esophagitis presence not specified   [facility] Duanne Limerick, MD   3 years ago Hypothyroidism, unspecified type   [facility] Duanne Limerick, MD   3 years ago Pulsatile neck mass   [facility] Duanne Limerick, MD

## 2021-05-05 ENCOUNTER — Ambulatory Visit: Payer: Managed Care, Other (non HMO) | Admitting: Family Medicine

## 2021-06-07 ENCOUNTER — Other Ambulatory Visit: Payer: Self-pay | Admitting: Family Medicine

## 2021-06-07 DIAGNOSIS — E782 Mixed hyperlipidemia: Secondary | ICD-10-CM

## 2021-06-07 NOTE — Telephone Encounter (Signed)
Requested medication (s) are due for refill today:  no  Requested medication (s) are on the active medication list: yes  Last refill: 04/21/2021  Future visit scheduled: no  Notes to clinic:  Patient had appt on 05/05/2021 which was canceled by provider  Review for refills   Requested Prescriptions  Pending Prescriptions Disp Refills   atorvastatin (LIPITOR) 10 MG tablet [Pharmacy Med Name: ATORVASTATIN CALCIUM 10 MG TAB] 90 tablet 1    Sig: TAKE (1) TABLET BY MOUTH EVERY DAY      Cardiovascular:  Antilipid - Statins Failed - 06/07/2021  8:50 AM      Failed - Triglycerides in normal range and within 360 days    Triglycerides  Date Value Ref Range Status  10/27/2020 188 (H) 0 - 149 mg/dL Final          Passed - Total Cholesterol in normal range and within 360 days    Cholesterol, Total  Date Value Ref Range Status  10/27/2020 170 100 - 199 mg/dL Final          Passed - LDL in normal range and within 360 days    LDL Chol Calc (NIH)  Date Value Ref Range Status  10/27/2020 93 0 - 99 mg/dL Final          Passed - HDL in normal range and within 360 days    HDL  Date Value Ref Range Status  10/27/2020 45 >39 mg/dL Final          Passed - Patient is not pregnant      Passed - Valid encounter within last 12 months    Recent Outpatient Visits           7 months ago Hypothyroidism, unspecified type   [facility] Duanne Limerick, MD   1 year ago Hypothyroidism, unspecified type   [facility] Duanne Limerick, MD   2 years ago Gastroesophageal reflux disease, esophagitis presence not specified   [facility] Duanne Limerick, MD   3 years ago Hypothyroidism, unspecified type   [facility] Duanne Limerick, MD   3 years ago Pulsatile neck mass   [facility] Duanne Limerick, MD

## 2021-06-09 ENCOUNTER — Other Ambulatory Visit: Payer: Self-pay | Admitting: Family Medicine

## 2021-06-09 ENCOUNTER — Telehealth: Payer: Self-pay

## 2021-06-09 DIAGNOSIS — E039 Hypothyroidism, unspecified: Secondary | ICD-10-CM

## 2021-06-09 MED ORDER — LEVOTHYROXINE SODIUM 125 MCG PO TABS: ORAL_TABLET | ORAL | 0 refills | Status: DC

## 2021-06-09 NOTE — Telephone Encounter (Signed)
Patient requesting levothyroxine (SYNTHROID) 125 MCG tablet to hold her over until 06/12/2021 medication follow up appointment. Patient states she's been off the medication for 5 days and patient is unsure if that is okay.   [facility] - [place], [place] - [address] Phone:  [phone]  Fax:  [phone]

## 2021-06-09 NOTE — Telephone Encounter (Signed)
atorvastatin (LIPITOR) 10 MG tablet   [facility] [address] [place] - [address]  [address] [place], [place] [address]  Phone:  [phone]  Fax:  [phone]

## 2021-06-09 NOTE — Telephone Encounter (Signed)
Pt has already been out of atorvastatin for a while now. Pt can get refill at her upcoming appt.  KP

## 2021-06-12 ENCOUNTER — Encounter: Payer: Self-pay | Admitting: Family Medicine

## 2021-06-12 ENCOUNTER — Ambulatory Visit (INDEPENDENT_AMBULATORY_CARE_PROVIDER_SITE_OTHER): Payer: Managed Care, Other (non HMO) | Admitting: Family Medicine

## 2021-06-12 ENCOUNTER — Other Ambulatory Visit: Payer: Self-pay

## 2021-06-12 VITALS — BP 120/80 | HR 72 | Ht 65.0 in | Wt 209.0 lb

## 2021-06-12 DIAGNOSIS — E039 Hypothyroidism, unspecified: Secondary | ICD-10-CM | POA: Diagnosis not present

## 2021-06-12 DIAGNOSIS — E782 Mixed hyperlipidemia: Secondary | ICD-10-CM | POA: Diagnosis not present

## 2021-06-12 DIAGNOSIS — K120 Recurrent oral aphthae: Secondary | ICD-10-CM | POA: Diagnosis not present

## 2021-06-12 DIAGNOSIS — K219 Gastro-esophageal reflux disease without esophagitis: Secondary | ICD-10-CM | POA: Diagnosis not present

## 2021-06-12 MED ORDER — OMEPRAZOLE 40 MG PO CPDR: DELAYED_RELEASE_CAPSULE | ORAL | 1 refills | Status: DC

## 2021-06-12 MED ORDER — ATORVASTATIN CALCIUM 10 MG PO TABS: ORAL_TABLET | ORAL | 1 refills | Status: DC

## 2021-06-12 MED ORDER — LEVOTHYROXINE SODIUM 125 MCG PO TABS: ORAL_TABLET | ORAL | 1 refills | Status: DC

## 2021-06-12 MED ORDER — VALACYCLOVIR HCL 500 MG PO TABS: ORAL_TABLET | ORAL | 5 refills | Status: DC

## 2021-06-12 NOTE — Progress Notes (Signed)
Date:  06/12/2021   Name:  Cheryl Campos   DOB:  01-14-1956   MRN:  [id number]   Chief Complaint: Hyperlipidemia, Gastroesophageal Reflux, Hypothyroidism, and viral syndrome  Hyperlipidemia This is a chronic problem. The current episode started more than 1 year ago. The problem is controlled. Recent lipid tests were reviewed and are high (triglycerides). She has no history of chronic renal disease, diabetes, hypothyroidism, liver disease, obesity or nephrotic syndrome. There are no known factors aggravating her hyperlipidemia. Pertinent negatives include no chest pain, focal sensory loss, focal weakness, leg pain, myalgias or shortness of breath. Current antihyperlipidemic treatment includes statins. The current treatment provides moderate improvement of lipids. Compliance problems include medication side effects (has been out of med x 5 days).  Risk factors for coronary artery disease include hypertension and dyslipidemia.  Gastroesophageal Reflux She reports no abdominal pain, no belching, no chest pain, no choking, no coughing, no dysphagia, no heartburn, no hoarse voice, no nausea, no sore throat or no wheezing. This is a chronic problem. The current episode started more than 1 year ago. The problem occurs rarely. The problem has been gradually improving (as long as I take my medicine). The symptoms are aggravated by certain foods. Pertinent negatives include no fatigue or weight loss. She has tried a PPI for the symptoms. The treatment provided moderate relief.  Thyroid Problem Presents for follow-up visit. Patient reports no anxiety, cold intolerance, constipation, depressed mood, diaphoresis, diarrhea, dry skin, fatigue, hair loss, heat intolerance, hoarse voice, leg swelling, menstrual problem, nail problem, palpitations, tremors, visual change, weight gain or weight loss. The symptoms have been stable. Her past medical history is significant for hyperlipidemia. There is no history of  diabetes.   Lab Results  Component Value Date   CREATININE 0.84 10/27/2020   BUN 14 10/27/2020   NA 142 10/27/2020   K 3.8 10/27/2020   CL 103 10/27/2020   CO2 23 10/27/2020   Lab Results  Component Value Date   CHOL 170 10/27/2020   HDL 45 10/27/2020   LDLCALC 93 10/27/2020   TRIG 188 (H) 10/27/2020   CHOLHDL 4.0 09/11/2019   Lab Results  Component Value Date   TSH 1.690 10/27/2020   No results found for: HGBA1C Lab Results  Component Value Date   WBC 12.4 (H) 03/14/2017   HGB 13.5 03/14/2017   HCT 41.8 03/14/2017   MCV 88.2 03/14/2017   PLT 232 03/14/2017   Lab Results  Component Value Date   ALT 16 10/27/2020   AST 15 10/27/2020   ALKPHOS 115 10/27/2020   BILITOT 0.4 10/27/2020     Review of Systems  Constitutional:  Negative for chills, diaphoresis, fatigue, fever, weight gain and weight loss.  HENT:  Negative for drooling, ear discharge, ear pain, hoarse voice and sore throat.   Respiratory:  Negative for cough, choking, shortness of breath and wheezing.   Cardiovascular:  Negative for chest pain, palpitations and leg swelling.  Gastrointestinal:  Negative for abdominal pain, blood in stool, constipation, diarrhea, dysphagia, heartburn and nausea.  Endocrine: Negative for cold intolerance, heat intolerance and polydipsia.  Genitourinary:  Negative for dysuria, frequency, hematuria, menstrual problem and urgency.  Musculoskeletal:  Negative for back pain, myalgias and neck pain.  Skin:  Negative for rash.  Allergic/Immunologic: Negative for environmental allergies.  Neurological:  Negative for dizziness, tremors, focal weakness and headaches.  Hematological:  Does not bruise/bleed easily.  Psychiatric/Behavioral:  Negative for suicidal ideas. The patient is not nervous/anxious.  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   Abnormal carotid pulse 08/27/2017   History of colon polyps    Rectal polyp     Esophageal dysphagia    Gastritis without bleeding    Tremor 06/11/2016   GERD (gastroesophageal reflux disease) 06/11/2016   Hyperlipidemia 06/11/2016   Fever blister 06/11/2016   Thyroid activity decreased 06/11/2016   EPIGASTRIC PAIN 04/20/2009   NONSPECIFIC ABN FINDING RAD & OTH EXAM GI TRACT 04/20/2009    No Known Allergies  Past Surgical History:  Procedure Laterality Date   ADENOIDECTOMY     COLONOSCOPY  2008   [place]   COLONOSCOPY WITH PROPOFOL N/A 07/29/2017   Procedure: COLONOSCOPY WITH PROPOFOL;  Surgeon: Midge Minium, MD;  Location: [facility];  Service: Gastroenterology;  Laterality: N/A;   ESOPHAGOGASTRODUODENOSCOPY (EGD) WITH PROPOFOL N/A 07/29/2017   Procedure: ESOPHAGOGASTRODUODENOSCOPY (EGD) WITH PROPOFOL;  Surgeon: Midge Minium, MD;  Location: [facility];  Service: Gastroenterology;  Laterality: N/A;  requests early   POLYPECTOMY  07/29/2017   Procedure: POLYPECTOMY;  Surgeon: Midge Minium, MD;  Location: [facility];  Service: Gastroenterology;;    Social History   Tobacco Use   Smoking status: Never   Smokeless tobacco: Never  Vaping Use   Vaping Use: Never used  Substance Use Topics   Alcohol use: No   Drug use: No     Medication list has been reviewed and updated.  Current Meds  Medication Sig   atorvastatin (LIPITOR) 10 MG tablet One a day   levothyroxine (SYNTHROID) 125 MCG tablet One a day   omeprazole (PRILOSEC) 40 MG capsule TAKE ONE (1) CAPSULE EACH DAY.   propranolol (INDERAL) 80 MG tablet Take 1 tablet (80 mg total) by mouth 2 (two) times daily. Dr Sherryll Burger   valACYclovir (VALTREX) 500 MG tablet TAKE (1) TABLET BY MOUTH TWICE DAILY AS NEEDED FOR FLARE UP    PHQ 2/9 Scores 06/12/2021 10/27/2020 02/05/2019 05/30/2017  PHQ - 2 Score 0 0 0 0  PHQ- 9 Score 0 0 1 0    GAD 7 : Generalized Anxiety Score 06/12/2021 10/27/2020  Nervous, Anxious, on Edge 0 0  Control/stop worrying 0 0  Worry too much - different things 0 0   Trouble relaxing 0 0  Restless 0 0  Easily annoyed or irritable 0 0  Afraid - awful might happen 0 0  Total GAD 7 Score 0 0    BP Readings from Last 3 Encounters:  06/12/21 120/80  10/27/20 124/80  01/14/20 (!) 142/90    Physical Exam Vitals and nursing note reviewed.  Constitutional:      General: She is not in acute distress.    Appearance: She is not diaphoretic.  HENT:     Head: Normocephalic and atraumatic.     Right Ear: Tympanic membrane, ear canal and external ear normal. There is no impacted cerumen.     Left Ear: Tympanic membrane, ear canal and external ear normal. There is no impacted cerumen.     Nose: Nose normal. No congestion or rhinorrhea.  Eyes:     General:        Right eye: No discharge.        Left eye: No discharge.     Conjunctiva/sclera: Conjunctivae normal.     Pupils: Pupils are equal, round, and reactive to light.  Neck:     Thyroid: No thyroid mass, thyromegaly or thyroid tenderness.     Vascular:  Normal carotid pulses. No carotid bruit, hepatojugular reflux or JVD.     Trachea: Trachea normal.  Cardiovascular:     Rate and Rhythm: Normal rate and regular rhythm.     Heart sounds: Normal heart sounds. No murmur heard.   No friction rub. No gallop.  Pulmonary:     Effort: Pulmonary effort is normal.     Breath sounds: Normal breath sounds. No wheezing, rhonchi or rales.  Abdominal:     General: Bowel sounds are normal.     Palpations: Abdomen is soft. There is no mass.     Tenderness: There is no abdominal tenderness. There is no guarding.  Musculoskeletal:        General: Normal range of motion.     Cervical back: Full passive range of motion without pain, normal range of motion and neck supple. No erythema.  Lymphadenopathy:     Cervical: No cervical adenopathy.     Right cervical: No superficial, deep or posterior cervical adenopathy.    Left cervical: No superficial, deep or posterior cervical adenopathy.  Skin:    General: Skin is  warm and dry.  Neurological:     Mental Status: She is alert.     Deep Tendon Reflexes: Reflexes are normal and symmetric.    Wt Readings from Last 3 Encounters:  06/12/21 209 lb (94.8 kg)  10/27/20 211 lb (95.7 kg)  01/14/20 215 lb (97.5 kg)    BP 120/80   Pulse 72   Ht 5\' 5"  (1.651 m)   Wt 209 lb (94.8 kg)   BMI 34.78 kg/m   Assessment and Plan:  1. Mixed hyperlipidemia Chronic.  Controlled.  Stable.  Patient has not been on her medication in the last couple of days and therefore we will hold on labs but we will continue atorvastatin at 10 mg once a day. - atorvastatin (LIPITOR) 10 MG tablet; One a day  Dispense: 90 tablet; Refill: 1  2. Hypothyroidism, unspecified type .  Controlled.  Stable.  Continue levothyroxine 125 mcg daily.  We will check thyroid panel with TSH. - levothyroxine (SYNTHROID) 125 MCG tablet; One a day  Dispense: 90 tablet; Refill: 1 - Thyroid Panel With TSH  3. Gastroesophageal reflux disease, unspecified whether esophagitis present Chronic.  Controlled.  Stable.  Continue omeprazole 40 mg once a day. - omeprazole (PRILOSEC) 40 MG capsule; TAKE ONE (1) CAPSULE EACH DAY.  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 - valACYclovir (VALTREX) 500 MG tablet; TAKE (1) TABLET BY MOUTH TWICE DAILY AS NEEDED FOR FLARE UP  Dispense: 30 tablet; Refill: 5

## 2021-06-13 ENCOUNTER — Other Ambulatory Visit: Payer: Self-pay

## 2021-06-13 DIAGNOSIS — E039 Hypothyroidism, unspecified: Secondary | ICD-10-CM

## 2021-06-13 LAB — THYROID PANEL WITH TSH
Free Thyroxine Index: 2.6 (ref 1.2–4.9)
T3 Uptake Ratio: 26 % (ref 24–39)
T4, Total: 10.1 ug/dL (ref 4.5–12.0)
TSH: 4.72 u[IU]/mL — ABNORMAL HIGH (ref 0.450–4.500)

## 2021-06-13 MED ORDER — LEVOTHYROXINE SODIUM 137 MCG PO TABS: ORAL_TABLET | ORAL | 1 refills | Status: DC

## 2021-08-01 ENCOUNTER — Other Ambulatory Visit: Payer: Self-pay

## 2021-08-01 ENCOUNTER — Telehealth: Payer: Self-pay

## 2021-08-01 DIAGNOSIS — E039 Hypothyroidism, unspecified: Secondary | ICD-10-CM

## 2021-08-01 NOTE — Telephone Encounter (Signed)
Copied from CRM [id number]. Topic: General - Other >> Aug 01, 2021  8:52 AM Gwenlyn Fudge wrote: Reason for CRM: Pt called stating that she is needing to have lab orders placed to get her thyroid checked. She states that she spoke with PCP about it last week and is requesting to have Dr. Judithann Graves place orders since that hasn't been done already. She is requesting to have these written out to take to [facility]. Please advise.

## 2021-08-01 NOTE — Telephone Encounter (Signed)
Called pt left VM to call back. Orders placed will leave at front desk. Closed for lunch between 12-1:10.  [facility] nurse may give results to patient if they return call to clinic, a CRM has been created.  KP

## 2021-08-02 NOTE — Telephone Encounter (Signed)
Pt aware orders are at front desk for pick p.  She states se will be here tomorrow.

## 2021-08-04 LAB — THYROID PANEL WITH TSH
Free Thyroxine Index: 3.8 (ref 1.2–4.9)
T3 Uptake Ratio: 31 % (ref 24–39)
T4, Total: 12.2 ug/dL — ABNORMAL HIGH (ref 4.5–12.0)
TSH: 0.86 u[IU]/mL (ref 0.450–4.500)

## 2021-08-11 ENCOUNTER — Other Ambulatory Visit: Payer: Self-pay | Admitting: Family Medicine

## 2021-08-11 DIAGNOSIS — E039 Hypothyroidism, unspecified: Secondary | ICD-10-CM

## 2021-08-11 NOTE — Telephone Encounter (Signed)
Requested medications are due for refill today yes  Requested medications are on the active medication list yes  Last refill 07/14/21  Last visit 06/12/21  Future visit scheduled 12/13/21  Notes to clinic Dose changed and only given 2 month rx, did not know if were going to recheck TSH with new dose. Please assess.   Requested Prescriptions  Pending Prescriptions Disp Refills   levothyroxine (SYNTHROID) 137 MCG tablet [Pharmacy Med Name: LEVOTHYROXINE SODIUM 137 MCG TAB] 30 tablet 1    Sig: TAKE (1) TABLET BY MOUTH EVERY DAY     Endocrinology:  Hypothyroid Agents Failed - 08/11/2021  9:33 AM      Failed - TSH needs to be rechecked within 3 months after an abnormal result. Refill until TSH is due.      Passed - TSH in normal range and within 360 days    TSH  Date Value Ref Range Status  08/03/2021 0.860 0.450 - 4.500 uIU/mL Final          Passed - Valid encounter within last 12 months    Recent Outpatient Visits           2 months ago Mixed hyperlipidemia   [facility] Duanne Limerick, MD   9 months ago Hypothyroidism, unspecified type   [facility] Duanne Limerick, MD   1 year ago Hypothyroidism, unspecified type   [facility] Duanne Limerick, MD   2 years ago Gastroesophageal reflux disease, esophagitis presence not specified   [facility] Duanne Limerick, MD   3 years ago Hypothyroidism, unspecified type   [facility] Duanne Limerick, MD       Future Appointments             In 4 months Duanne Limerick, MD [facility], [facility]

## 2021-09-19 ENCOUNTER — Other Ambulatory Visit: Payer: Self-pay | Admitting: Family Medicine

## 2021-09-19 DIAGNOSIS — R251 Tremor, unspecified: Secondary | ICD-10-CM

## 2021-09-19 MED ORDER — PROPRANOLOL HCL 80 MG PO TABS: 80.0000 mg | ORAL_TABLET | Freq: Two times a day (BID) | ORAL | 0 refills | Status: DC

## 2021-09-19 NOTE — Telephone Encounter (Signed)
Requested medication (s) are due for refill today:   Yes  Requested medication (s) are on the active medication list:   Yes  Future visit scheduled:   Yes   Last ordered: 10/27/2020 #180, 1 refill  Returned because the provider who used to prescribe this has retired, Dr. Sherryll Burger.     Pt is requesting Dr. Yetta Barre fill it.   Thanks   Requested Prescriptions  Pending Prescriptions Disp Refills   propranolol (INDERAL) 80 MG tablet 180 tablet 1    Sig: Take 1 tablet (80 mg total) by mouth 2 (two) times daily. Dr Sherryll Burger     Cardiovascular:  Beta Blockers Passed - 09/19/2021 11:38 AM      Passed - Last BP in normal range    BP Readings from Last 1 Encounters:  06/12/21 120/80          Passed - Last Heart Rate in normal range    Pulse Readings from Last 1 Encounters:  06/12/21 72          Passed - Valid encounter within last 6 months    Recent Outpatient Visits           3 months ago Mixed hyperlipidemia   [facility] Duanne Limerick, MD   10 months ago Hypothyroidism, unspecified type   [facility] Duanne Limerick, MD   2 years ago Hypothyroidism, unspecified type   [facility] Duanne Limerick, MD   2 years ago Gastroesophageal reflux disease, esophagitis presence not specified   [facility] Duanne Limerick, MD   3 years ago Hypothyroidism, unspecified type   [facility] Duanne Limerick, MD       Future Appointments             In 2 months Duanne Limerick, MD [facility], [facility]

## 2021-09-19 NOTE — Telephone Encounter (Signed)
Medication Refill - Medication: Propanolol   Has the patient contacted their pharmacy? Yes.   Pt called stating that the pharmacy states that they have contacted office and have received no response. PT also states that this has been being filled by another provider, but that provider has retired and she is requesting to have PCP send this in. Please advise.  (Agent: If no, request that the patient contact the pharmacy for the refill. If patient does not wish to contact the pharmacy document the reason why and proceed with request.) (Agent: If yes, when and what did the pharmacy advise?)  Preferred Pharmacy (with phone number or street name):  Has the patient  [facility] [place], [place] - [address]  [address] [place] [address]  Phone: [phone] Fax: [phone]  Hours: Not open 24 hours  been seen for an appointment in the last year OR does the patient have an upcoming appointment? Yes.    Agent: Please be advised that RX refills may take up to 3 business days. We ask that you follow-up with your pharmacy.

## 2021-12-13 ENCOUNTER — Other Ambulatory Visit: Payer: Self-pay

## 2021-12-13 ENCOUNTER — Ambulatory Visit (INDEPENDENT_AMBULATORY_CARE_PROVIDER_SITE_OTHER): Payer: Managed Care, Other (non HMO) | Admitting: Family Medicine

## 2021-12-13 ENCOUNTER — Encounter: Payer: Self-pay | Admitting: Family Medicine

## 2021-12-13 VITALS — BP 110/72 | HR 66 | Ht 69.0 in | Wt 215.0 lb

## 2021-12-13 DIAGNOSIS — K219 Gastro-esophageal reflux disease without esophagitis: Secondary | ICD-10-CM

## 2021-12-13 DIAGNOSIS — K120 Recurrent oral aphthae: Secondary | ICD-10-CM | POA: Diagnosis not present

## 2021-12-13 DIAGNOSIS — E782 Mixed hyperlipidemia: Secondary | ICD-10-CM

## 2021-12-13 DIAGNOSIS — R69 Illness, unspecified: Secondary | ICD-10-CM

## 2021-12-13 DIAGNOSIS — E039 Hypothyroidism, unspecified: Secondary | ICD-10-CM

## 2021-12-13 MED ORDER — OMEPRAZOLE 40 MG PO CPDR: DELAYED_RELEASE_CAPSULE | ORAL | 1 refills | Status: DC

## 2021-12-13 MED ORDER — LEVOTHYROXINE SODIUM 137 MCG PO TABS: ORAL_TABLET | ORAL | 1 refills | Status: DC

## 2021-12-13 MED ORDER — ATORVASTATIN CALCIUM 10 MG PO TABS: ORAL_TABLET | ORAL | 1 refills | Status: DC

## 2021-12-13 MED ORDER — VALACYCLOVIR HCL 500 MG PO TABS: ORAL_TABLET | ORAL | 5 refills | Status: DC

## 2021-12-13 NOTE — Progress Notes (Signed)
Date:  12/13/2021   Name:  Cheryl Campos   DOB:  1956/03/11   MRN:  [id number]   Chief Complaint: Hypothyroidism, Gastroesophageal Reflux, Hyperlipidemia, and viral disease (Takes valacyclovir)  Gastroesophageal Reflux She reports no abdominal pain, no belching, no chest pain, no choking, no coughing, no dysphagia, no heartburn, no hoarse voice, no nausea, no sore throat or no wheezing. This is a chronic problem. The current episode started more than 1 year ago. The problem has been gradually improving. Exacerbated by: must take medication. Pertinent negatives include no anemia, fatigue, melena, muscle weakness, orthopnea or weight loss. She has tried a PPI for the symptoms. The treatment provided moderate relief.  Hyperlipidemia The current episode started more than 1 year ago. The problem is controlled. Recent lipid tests were reviewed and are normal. She has no history of chronic renal disease, diabetes, hypothyroidism, liver disease, obesity or nephrotic syndrome. Pertinent negatives include no chest pain, focal sensory loss, focal weakness, leg pain, myalgias or shortness of breath. Current antihyperlipidemic treatment includes statins. The current treatment provides moderate improvement of lipids. There are no compliance problems.   Thyroid Problem Presents for follow-up visit. Symptoms include cold intolerance and tremors. Patient reports no anxiety, constipation, depressed mood, diaphoresis, diarrhea, dry skin, fatigue, hair loss, heat intolerance, hoarse voice, menstrual problem, nail problem, palpitations, weight gain or weight loss. Her past medical history is significant for hyperlipidemia. There is no history of diabetes.   Lab Results  Component Value Date   NA 142 10/27/2020   K 3.8 10/27/2020   CO2 23 10/27/2020   GLUCOSE 82 10/27/2020   BUN 14 10/27/2020   CREATININE 0.84 10/27/2020   CALCIUM 8.9 10/27/2020   GFRNONAA 74 10/27/2020   Lab Results  Component Value Date    CHOL 170 10/27/2020   HDL 45 10/27/2020   LDLCALC 93 10/27/2020   TRIG 188 (H) 10/27/2020   CHOLHDL 4.0 09/11/2019   Lab Results  Component Value Date   TSH 0.860 08/03/2021   No results found for: HGBA1C Lab Results  Component Value Date   WBC 12.4 (H) 03/14/2017   HGB 13.5 03/14/2017   HCT 41.8 03/14/2017   MCV 88.2 03/14/2017   PLT 232 03/14/2017   Lab Results  Component Value Date   ALT 16 10/27/2020   AST 15 10/27/2020   ALKPHOS 115 10/27/2020   BILITOT 0.4 10/27/2020   No results found for: 25OHVITD2, 25OHVITD3, VD25OH   Review of Systems  Constitutional:  Negative for chills, diaphoresis, fatigue, fever, weight gain and weight loss.  HENT:  Negative for drooling, ear discharge, ear pain, hoarse voice and sore throat.   Respiratory:  Negative for cough, choking, shortness of breath and wheezing.   Cardiovascular:  Negative for chest pain, palpitations and leg swelling.  Gastrointestinal:  Negative for abdominal pain, blood in stool, constipation, diarrhea, dysphagia, heartburn, melena and nausea.  Endocrine: Positive for cold intolerance. Negative for heat intolerance and polydipsia.  Genitourinary:  Negative for dysuria, frequency, hematuria, menstrual problem and urgency.  Musculoskeletal:  Negative for back pain, myalgias, muscle weakness and neck pain.  Skin:  Negative for rash.  Allergic/Immunologic: Negative for environmental allergies.  Neurological:  Positive for tremors. Negative for dizziness, focal weakness and headaches.  Hematological:  Does not bruise/bleed easily.  Psychiatric/Behavioral:  Negative for suicidal ideas. The patient is not nervous/anxious.    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   Abnormal carotid pulse  08/27/2017   History of colon polyps    Rectal polyp    Esophageal dysphagia    Gastritis without bleeding    Tremor 06/11/2016   GERD (gastroesophageal reflux disease)  06/11/2016   Hyperlipidemia 06/11/2016   Fever blister 06/11/2016   Thyroid activity decreased 06/11/2016   EPIGASTRIC PAIN 04/20/2009   NONSPECIFIC ABN FINDING RAD & OTH EXAM GI TRACT 04/20/2009    No Known Allergies  Past Surgical History:  Procedure Laterality Date   ADENOIDECTOMY     COLONOSCOPY  2008   [place]   COLONOSCOPY WITH PROPOFOL N/A 07/29/2017   Procedure: COLONOSCOPY WITH PROPOFOL;  Surgeon: Lucilla Lame, MD;  Location: [facility];  Service: Gastroenterology;  Laterality: N/A;   ESOPHAGOGASTRODUODENOSCOPY (EGD) WITH PROPOFOL N/A 07/29/2017   Procedure: ESOPHAGOGASTRODUODENOSCOPY (EGD) WITH PROPOFOL;  Surgeon: Lucilla Lame, MD;  Location: [place];  Service: Gastroenterology;  Laterality: N/A;  requests early   POLYPECTOMY  07/29/2017   Procedure: POLYPECTOMY;  Surgeon: Lucilla Lame, MD;  Location: [place];  Service: Gastroenterology;;    Social History   Tobacco Use   Smoking status: Never   Smokeless tobacco: Never  Vaping Use   Vaping Use: Never used  Substance Use Topics   Alcohol use: No   Drug use: No     Medication list has been reviewed and updated.  Current Meds  Medication Sig   atorvastatin (LIPITOR) 10 MG tablet One a day   levothyroxine (SYNTHROID) 137 MCG tablet TAKE (1) TABLET BY MOUTH EVERY DAY   omeprazole (PRILOSEC) 40 MG capsule TAKE ONE (1) CAPSULE EACH DAY.   propranolol (INDERAL) 80 MG tablet Take 1 tablet (80 mg total) by mouth 2 (two) times daily. Dr Manuella Ghazi   valACYclovir (VALTREX) 500 MG tablet TAKE (1) TABLET BY MOUTH TWICE DAILY AS NEEDED FOR FLARE UP    PHQ 2/9 Scores 12/13/2021 06/12/2021 10/27/2020 02/05/2019  PHQ - 2 Score 0 0 0 0  PHQ- 9 Score 0 0 0 1    GAD 7 : Generalized Anxiety Score 12/13/2021 06/12/2021 10/27/2020  Nervous, Anxious, on Edge 0 0 0  Control/stop worrying 0 0 0  Worry too much - different things 0 0 0  Trouble relaxing 0 0 0  Restless 0 0 0  Easily annoyed or irritable 0 0  0  Afraid - awful might happen 0 0 0  Total GAD 7 Score 0 0 0  Anxiety Difficulty Not difficult at all - -    BP Readings from Last 3 Encounters:  12/13/21 110/72  06/12/21 120/80  10/27/20 124/80    Physical Exam Vitals and nursing note reviewed.  Constitutional:      Appearance: She is well-developed.  HENT:     Head: Normocephalic.     Right Ear: Tympanic membrane, ear canal and external ear normal.     Left Ear: Tympanic membrane, ear canal and external ear normal.     Nose: Nose normal. No congestion.     Mouth/Throat:     Mouth: Mucous membranes are moist.     Pharynx: No oropharyngeal exudate or posterior oropharyngeal erythema.  Eyes:     General: Lids are everted, no foreign bodies appreciated. No scleral icterus.       Left eye: No foreign body or hordeolum.     Conjunctiva/sclera: Conjunctivae normal.     Right eye: Right conjunctiva is not injected.     Left eye: Left conjunctiva is not injected.     Pupils: Pupils  are equal, round, and reactive to light.  Neck:     Thyroid: No thyromegaly.     Vascular: No JVD.     Trachea: No tracheal deviation.  Cardiovascular:     Rate and Rhythm: Normal rate and regular rhythm.     Heart sounds: Normal heart sounds. No murmur heard.   No friction rub. No gallop.  Pulmonary:     Effort: Pulmonary effort is normal. No respiratory distress.     Breath sounds: Normal breath sounds. No wheezing, rhonchi or rales.  Abdominal:     General: Bowel sounds are normal. There is no distension.     Palpations: Abdomen is soft. There is no mass.     Tenderness: There is no abdominal tenderness. There is no guarding or rebound.     Hernia: No hernia is present.  Musculoskeletal:        General: No tenderness. Normal range of motion.     Cervical back: Normal range of motion and neck supple.  Lymphadenopathy:     Cervical: No cervical adenopathy.  Skin:    General: Skin is warm.     Findings: No lesion or rash.  Neurological:      Mental Status: She is alert and oriented to person, place, and time.     Cranial Nerves: No cranial nerve deficit.     Deep Tendon Reflexes: Reflexes normal.  Psychiatric:        Mood and Affect: Mood is not anxious or depressed.    Wt Readings from Last 3 Encounters:  12/13/21 215 lb (97.5 kg)  06/12/21 209 lb (94.8 kg)  10/27/20 211 lb (95.7 kg)    BP 110/72    Pulse 66    Ht 5\' 9"  (1.753 m)    Wt 215 lb (97.5 kg)    SpO2 97%    BMI 31.75 kg/m   Assessment and Plan:  1. Hypothyroidism, unspecified type Chronic.  Controlled.  Stable.  Continue levothyroxine 137 mcg once a day.  Will check TSH for current level of control and if stable will probably continue at the current dosing. - levothyroxine (SYNTHROID) 137 MCG tablet; TAKE (1) TABLET BY MOUTH EVERY DAY  Dispense: 90 tablet; Refill: 1 - TSH  2. Mixed hyperlipidemia Chronic.  Controlled.  Stable.  Continue atorvastatin 10 mg once a day.  Will check lipid panel for current level of control and comprehensive metabolic panel for drug reaction to liver. - atorvastatin (LIPITOR) 10 MG tablet; One a day  Dispense: 90 tablet; Refill: 1 - Comprehensive Metabolic Panel (CMET) - Lipid Panel With LDL/HDL Ratio  3. Gastroesophageal reflux disease, unspecified whether esophagitis present Chronic.  Controlled.  Stable.  Patient denies any dysphagia.  Continue omeprazole 40 mg once a day where it controls her reflux as long as she is taking this. - omeprazole (PRILOSEC) 40 MG capsule; TAKE ONE (1) CAPSULE EACH DAY.  Dispense: 90 capsule; Refill: 1  4. Stomatitis herpetiformis Chronic.  Controlled.  As needed patient takes Valtrex 500 mg twice a day for flares. - valACYclovir (VALTREX) 500 MG tablet; TAKE (1) TABLET BY MOUTH TWICE DAILY AS NEEDED FOR FLARE UP  Dispense: 30 tablet; Refill: 5  5. Taking medication for chronic disease As noted above patient is currently on statin agent and we will check comprehensive metabolic panel to  monitor for drug interactions. - Comprehensive Metabolic Panel (CMET)

## 2021-12-14 LAB — LIPID PANEL WITH LDL/HDL RATIO
Cholesterol, Total: 171 mg/dL (ref 100–199)
HDL: 49 mg/dL (ref >39)
LDL Chol Calc (NIH): 95 mg/dL (ref 0–99)
LDL/HDL Ratio: 1.9 ratio (ref 0.0–3.2)
Triglycerides: 157 mg/dL — ABNORMAL HIGH (ref 0–149)
VLDL Cholesterol Cal: 27 mg/dL (ref 5–40)

## 2021-12-14 LAB — COMPREHENSIVE METABOLIC PANEL
ALT: 17 IU/L (ref 0–32)
AST: 15 IU/L (ref 0–40)
Albumin/Globulin Ratio: 1.8 (ref 1.2–2.2)
Albumin: 4.8 g/dL (ref 3.8–4.8)
Alkaline Phosphatase: 104 IU/L (ref 44–121)
BUN/Creatinine Ratio: 18 (ref 12–28)
BUN: 13 mg/dL (ref 8–27)
Bilirubin Total: 0.5 mg/dL (ref 0.0–1.2)
CO2: 24 mmol/L (ref 20–29)
Calcium: 9.2 mg/dL (ref 8.7–10.3)
Chloride: 101 mmol/L (ref 96–106)
Creatinine, Ser: 0.74 mg/dL (ref 0.57–1.00)
Globulin, Total: 2.6 g/dL (ref 1.5–4.5)
Glucose: 110 mg/dL — ABNORMAL HIGH (ref 70–99)
Potassium: 4 mmol/L (ref 3.5–5.2)
Sodium: 141 mmol/L (ref 134–144)
Total Protein: 7.4 g/dL (ref 6.0–8.5)
eGFR: 90 mL/min/{1.73_m2} (ref >59)

## 2021-12-14 LAB — TSH: TSH: 1.15 u[IU]/mL (ref 0.450–4.500)

## 2022-06-07 ENCOUNTER — Other Ambulatory Visit: Payer: Self-pay | Admitting: Family Medicine

## 2022-06-07 DIAGNOSIS — E782 Mixed hyperlipidemia: Secondary | ICD-10-CM

## 2022-06-12 ENCOUNTER — Encounter: Payer: Self-pay | Admitting: Family Medicine

## 2022-06-12 ENCOUNTER — Telehealth: Payer: Self-pay

## 2022-06-12 ENCOUNTER — Ambulatory Visit (INDEPENDENT_AMBULATORY_CARE_PROVIDER_SITE_OTHER): Payer: Managed Care, Other (non HMO) | Admitting: Family Medicine

## 2022-06-12 VITALS — BP 130/80 | HR 74 | Ht 69.0 in | Wt 212.0 lb

## 2022-06-12 DIAGNOSIS — E782 Mixed hyperlipidemia: Secondary | ICD-10-CM

## 2022-06-12 DIAGNOSIS — Z1231 Encounter for screening mammogram for malignant neoplasm of breast: Secondary | ICD-10-CM | POA: Diagnosis not present

## 2022-06-12 DIAGNOSIS — E039 Hypothyroidism, unspecified: Secondary | ICD-10-CM | POA: Diagnosis not present

## 2022-06-12 DIAGNOSIS — K219 Gastro-esophageal reflux disease without esophagitis: Secondary | ICD-10-CM | POA: Diagnosis not present

## 2022-06-12 DIAGNOSIS — K120 Recurrent oral aphthae: Secondary | ICD-10-CM

## 2022-06-12 MED ORDER — OMEPRAZOLE 40 MG PO CPDR: DELAYED_RELEASE_CAPSULE | ORAL | 1 refills | Status: DC

## 2022-06-12 MED ORDER — VALACYCLOVIR HCL 500 MG PO TABS: ORAL_TABLET | ORAL | 5 refills | Status: DC

## 2022-06-12 MED ORDER — ATORVASTATIN CALCIUM 10 MG PO TABS: ORAL_TABLET | ORAL | 1 refills | Status: DC

## 2022-06-12 MED ORDER — LEVOTHYROXINE SODIUM 137 MCG PO TABS: ORAL_TABLET | ORAL | 1 refills | Status: DC

## 2022-06-12 NOTE — Telephone Encounter (Signed)
Called pt with mammo appt- 07/10/22 @ 1040  in [place]

## 2022-06-12 NOTE — Progress Notes (Signed)
Date:  06/12/2022   Name:  Cheryl Campos   DOB:  04-20-1956   MRN:  [id number]   Chief Complaint: Hyperlipidemia, Hypothyroidism, and Gastroesophageal Reflux  Hyperlipidemia This is a chronic problem. The current episode started more than 1 year ago. The problem is controlled. Recent lipid tests were reviewed and are normal. She has no history of chronic renal disease, diabetes, hypothyroidism, liver disease, obesity or nephrotic syndrome. There are no known factors aggravating her hyperlipidemia. Pertinent negatives include no chest pain. Current antihyperlipidemic treatment includes statins. The current treatment provides moderate improvement of lipids. There are no compliance problems.   Gastroesophageal Reflux She reports no abdominal pain, no chest pain, no choking, no dysphagia, no heartburn or no hoarse voice. The problem has been gradually improving. The symptoms are aggravated by certain foods (german food). Pertinent negatives include no anemia, fatigue, melena, muscle weakness, orthopnea or weight loss. She has tried a PPI for the symptoms. The treatment provided moderate relief.  Thyroid Problem Presents for follow-up visit. Patient reports no anxiety, cold intolerance, constipation, depressed mood, diaphoresis, diarrhea, dry skin, fatigue, hair loss, heat intolerance, hoarse voice, leg swelling, menstrual problem, nail problem, palpitations, tremors, visual change, weight gain or weight loss. The symptoms have been stable. Her past medical history is significant for hyperlipidemia. There is no history of diabetes.    Lab Results  Component Value Date   NA 141 12/13/2021   K 4.0 12/13/2021   CO2 24 12/13/2021   GLUCOSE 110 (H) 12/13/2021   BUN 13 12/13/2021   CREATININE 0.74 12/13/2021   CALCIUM 9.2 12/13/2021   EGFR 90 12/13/2021   GFRNONAA 74 10/27/2020   Lab Results  Component Value Date   CHOL 171 12/13/2021   HDL 49 12/13/2021   LDLCALC 95 12/13/2021   TRIG 157  (H) 12/13/2021   CHOLHDL 4.0 09/11/2019   Lab Results  Component Value Date   TSH 1.150 12/13/2021   No results found for: "HGBA1C" Lab Results  Component Value Date   WBC 12.4 (H) 03/14/2017   HGB 13.5 03/14/2017   HCT 41.8 03/14/2017   MCV 88.2 03/14/2017   PLT 232 03/14/2017   Lab Results  Component Value Date   ALT 17 12/13/2021   AST 15 12/13/2021   ALKPHOS 104 12/13/2021   BILITOT 0.5 12/13/2021   No results found for: "25OHVITD2", "25OHVITD3", "VD25OH"   Review of Systems  Constitutional:  Negative for diaphoresis, fatigue, unexpected weight change, weight gain and weight loss.  HENT:  Negative for hoarse voice.   Respiratory:  Negative for choking.   Cardiovascular:  Negative for chest pain, palpitations and leg swelling.  Gastrointestinal:  Negative for abdominal pain, constipation, diarrhea, dysphagia, heartburn and melena.  Endocrine: Negative for cold intolerance and heat intolerance.  Genitourinary:  Negative for menstrual problem.  Musculoskeletal:  Negative for back pain and muscle weakness.  Neurological:  Negative for dizziness and tremors.  Hematological:  Negative for adenopathy. Does not bruise/bleed easily.  Psychiatric/Behavioral:  The patient is not nervous/anxious.     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   Abnormal carotid pulse 08/27/2017   History of colon polyps    Rectal polyp    Esophageal dysphagia    Gastritis without bleeding    Tremor 06/11/2016   GERD (gastroesophageal reflux disease) 06/11/2016   Hyperlipidemia 06/11/2016   Fever blister 06/11/2016   Thyroid activity decreased 06/11/2016   EPIGASTRIC PAIN 04/20/2009  NONSPECIFIC ABN FINDING RAD & OTH EXAM GI TRACT 04/20/2009    No Known Allergies  Past Surgical History:  Procedure Laterality Date   ADENOIDECTOMY     COLONOSCOPY  2008   [place]   COLONOSCOPY WITH PROPOFOL N/A 07/29/2017   Procedure: COLONOSCOPY WITH  PROPOFOL;  Surgeon: Lucilla Lame, MD;  Location: [facility];  Service: Gastroenterology;  Laterality: N/A;   ESOPHAGOGASTRODUODENOSCOPY (EGD) WITH PROPOFOL N/A 07/29/2017   Procedure: ESOPHAGOGASTRODUODENOSCOPY (EGD) WITH PROPOFOL;  Surgeon: Lucilla Lame, MD;  Location: [facility];  Service: Gastroenterology;  Laterality: N/A;  requests early   POLYPECTOMY  07/29/2017   Procedure: POLYPECTOMY;  Surgeon: Lucilla Lame, MD;  Location: [place];  Service: Gastroenterology;;    Social History   Tobacco Use   Smoking status: Never   Smokeless tobacco: Never  Vaping Use   Vaping Use: Never used  Substance Use Topics   Alcohol use: No   Drug use: No     Medication list has been reviewed and updated.  Current Meds  Medication Sig   atorvastatin (LIPITOR) 10 MG tablet TAKE (1) TABLET BY MOUTH EVERY DAY   levothyroxine (SYNTHROID) 137 MCG tablet TAKE (1) TABLET BY MOUTH EVERY DAY   omeprazole (PRILOSEC) 40 MG capsule TAKE ONE (1) CAPSULE EACH DAY.   propranolol (INDERAL) 80 MG tablet Take 1 tablet (80 mg total) by mouth 2 (two) times daily. Dr Manuella Ghazi   valACYclovir (VALTREX) 500 MG tablet TAKE (1) TABLET BY MOUTH TWICE DAILY AS NEEDED FOR FLARE UP       06/12/2022    9:29 AM 12/13/2021    9:20 AM 06/12/2021    9:36 AM 10/27/2020    4:12 PM  GAD 7 : Generalized Anxiety Score  Nervous, Anxious, on Edge 0 0 0 0  Control/stop worrying 0 0 0 0  Worry too much - different things 1 0 0 0  Trouble relaxing 0 0 0 0  Restless 0 0 0 0  Easily annoyed or irritable 0 0 0 0  Afraid - awful might happen 0 0 0 0  Total GAD 7 Score 1 0 0 0  Anxiety Difficulty Not difficult at all Not difficult at all         06/12/2022    9:29 AM 12/13/2021    9:20 AM 06/12/2021    9:36 AM  Depression screen PHQ 2/9  Decreased Interest 0 0 0  Down, Depressed, Hopeless 1 0 0  PHQ - 2 Score 1 0 0  Altered sleeping 0 0 0  Tired, decreased energy 0 0 0  Change in appetite 0 0 0   Feeling bad or failure about yourself  0 0 0  Trouble concentrating 0 0 0  Moving slowly or fidgety/restless 0 0 0  Suicidal thoughts 0 0 0  PHQ-9 Score 1 0 0  Difficult doing work/chores Not difficult at all Not difficult at all     BP Readings from Last 3 Encounters:  06/12/22 130/80  12/13/21 110/72  06/12/21 120/80    Physical Exam Vitals and nursing note reviewed. Exam conducted with a chaperone present.  Constitutional:      General: She is not in acute distress.    Appearance: She is not diaphoretic.  HENT:     Head: Normocephalic and atraumatic.     Right Ear: Tympanic membrane and external ear normal.     Left Ear: Tympanic membrane and external ear normal.     Nose: Nose normal. No  congestion or rhinorrhea.  Eyes:     General:        Right eye: No discharge.        Left eye: No discharge.     Pupils: Pupils are equal, round, and reactive to light.  Neck:     Thyroid: No thyroid mass, thyromegaly or thyroid tenderness.     Vascular: No JVD.  Cardiovascular:     Rate and Rhythm: Normal rate and regular rhythm.     Heart sounds: Normal heart sounds. No murmur heard.    No friction rub. No gallop.  Pulmonary:     Effort: Pulmonary effort is normal.     Breath sounds: Normal breath sounds. No wheezing or rhonchi.  Abdominal:     Palpations: Abdomen is soft. There is no mass.     Tenderness: There is no abdominal tenderness. There is no guarding.  Musculoskeletal:        General: Normal range of motion.     Cervical back: Normal range of motion and neck supple.  Lymphadenopathy:     Cervical: No cervical adenopathy.  Skin:    General: Skin is warm and dry.  Neurological:     Mental Status: She is alert.     Wt Readings from Last 3 Encounters:  06/12/22 212 lb (96.2 kg)  12/13/21 215 lb (97.5 kg)  06/12/21 209 lb (94.8 kg)    BP 130/80   Pulse 74   Ht $R'5\' 9"'tq$  (1.753 m)   Wt 212 lb (96.2 kg)   BMI 31.31 kg/m   Assessment and Plan:  1. Mixed  hyperlipidemia Chronic.  Controlled.  Stable.  Continue atorvastatin 10 mg today.  We will check renal - atorvastatin (LIPITOR) 10 MG tablet; TAKE (1) TABLET BY MOUTH EVERY DAY  Dispense: 90 tablet; Refill: 1 - Renal Function Panel  2. Hypothyroidism, unspecified type Controlled.  Stable.  Will check thyroid panel with TSH has not been appropriate we will continue 37 mcg daily. - levothyroxine (SYNTHROID) 137 MCG tablet; TAKE (1) TABLET BY MOUTH EVERY DAY  Dispense: 90 tablet; Refill: 1 - Thyroid Panel With TSH - Renal Function Panel  3. Gastroesophageal reflux disease, unspecified whether esophagitis present Chronic.  Controlled.  Stable.  Excellent results with omeprazole 40 mg once a day.  And this will be continued - omeprazole (PRILOSEC) 40 MG capsule; TAKE ONE (1) CAPSULE EACH DAY.  Dispense: 90 capsule; Refill: 1  4. Stomatitis herpetiformis Chronic.  Episodic.  Stable.  As needed for flareup patient will take Valtrex 500 mg twice a day. - valACYclovir (VALTREX) 500 MG tablet; TAKE (1) TABLET BY MOUTH TWICE DAILY AS NEEDED FOR FLARE UP  Dispense: 30 tablet; Refill: 5  5. Breast cancer screening by mammogram Discussed with patient and screening mammogram scheduled. - MM 3D SCREEN BREAST BILATERAL    Otilio Miu, MD

## 2022-06-13 LAB — THYROID PANEL WITH TSH
Free Thyroxine Index: 3.4 (ref 1.2–4.9)
T3 Uptake Ratio: 30 % (ref 24–39)
T4, Total: 11.3 ug/dL (ref 4.5–12.0)
TSH: 0.777 u[IU]/mL (ref 0.450–4.500)

## 2022-06-13 LAB — RENAL FUNCTION PANEL
Albumin: 4.3 g/dL (ref 3.9–4.9)
BUN/Creatinine Ratio: 18 (ref 12–28)
BUN: 12 mg/dL (ref 8–27)
CO2: 23 mmol/L (ref 20–29)
Calcium: 9.1 mg/dL (ref 8.7–10.3)
Chloride: 103 mmol/L (ref 96–106)
Creatinine, Ser: 0.67 mg/dL (ref 0.57–1.00)
Glucose: 118 mg/dL — ABNORMAL HIGH (ref 70–99)
Phosphorus: 4.7 mg/dL — ABNORMAL HIGH (ref 3.0–4.3)
Potassium: 4.7 mmol/L (ref 3.5–5.2)
Sodium: 142 mmol/L (ref 134–144)
eGFR: 98 mL/min/{1.73_m2} (ref >59)

## 2022-06-28 ENCOUNTER — Other Ambulatory Visit: Payer: Self-pay | Admitting: Family Medicine

## 2022-06-28 DIAGNOSIS — E039 Hypothyroidism, unspecified: Secondary | ICD-10-CM

## 2022-07-10 ENCOUNTER — Ambulatory Visit: Payer: Managed Care, Other (non HMO)

## 2022-07-16 ENCOUNTER — Ambulatory Visit
Admission: RE | Admit: 2022-07-16 | Discharge: 2022-07-16 | Disposition: A | Discharge status: Home / Self Care | Payer: Medicare HMO | Source: Ambulatory Visit | Attending: Family Medicine | Admitting: Family Medicine

## 2022-07-16 DIAGNOSIS — Z1231 Encounter for screening mammogram for malignant neoplasm of breast: Secondary | ICD-10-CM | POA: Diagnosis present

## 2022-12-13 ENCOUNTER — Ambulatory Visit: Payer: Medicare HMO | Admitting: Family Medicine

## 2022-12-13 ENCOUNTER — Encounter: Payer: Self-pay | Admitting: Family Medicine

## 2022-12-13 VITALS — BP 120/70 | HR 60 | Ht 66.0 in | Wt 189.6 lb

## 2022-12-13 DIAGNOSIS — K120 Recurrent oral aphthae: Secondary | ICD-10-CM

## 2022-12-13 DIAGNOSIS — K219 Gastro-esophageal reflux disease without esophagitis: Secondary | ICD-10-CM

## 2022-12-13 DIAGNOSIS — E039 Hypothyroidism, unspecified: Secondary | ICD-10-CM

## 2022-12-13 DIAGNOSIS — E782 Mixed hyperlipidemia: Secondary | ICD-10-CM

## 2022-12-13 DIAGNOSIS — Z1211 Encounter for screening for malignant neoplasm of colon: Secondary | ICD-10-CM

## 2022-12-13 MED ORDER — OMEPRAZOLE 40 MG PO CPDR: DELAYED_RELEASE_CAPSULE | ORAL | 1 refills | Status: DC

## 2022-12-13 MED ORDER — VALACYCLOVIR HCL 500 MG PO TABS: ORAL_TABLET | ORAL | 5 refills | Status: DC

## 2022-12-13 MED ORDER — ATORVASTATIN CALCIUM 10 MG PO TABS: ORAL_TABLET | ORAL | 1 refills | Status: DC

## 2022-12-13 MED ORDER — LEVOTHYROXINE SODIUM 137 MCG PO TABS: ORAL_TABLET | ORAL | 1 refills | Status: DC

## 2022-12-13 NOTE — Patient Instructions (Signed)
Why follow it? Research shows. Those who follow the Mediterranean diet have a reduced risk of heart disease  The diet is associated with a reduced incidence of Parkinson's and Alzheimer's diseases People following the diet may have longer life expectancies and lower rates of chronic diseases  The Dietary Guidelines for Americans recommends the Mediterranean diet as an eating plan to promote health and prevent disease  What Is the Mediterranean Diet?  Healthy eating plan based on typical foods and recipes of Mediterranean-style cooking The diet is primarily a plant based diet; these foods should make up a majority of meals   Starches - Plant based foods should make up a majority of meals - They are an important sources of vitamins, minerals, energy, antioxidants, and fiber - Choose whole grains, foods high in fiber and minimally processed items  - Typical grain sources include wheat, oats, barley, corn, brown rice, bulgar, farro, millet, polenta, couscous  - Various types of beans include chickpeas, lentils, fava beans, black beans, white beans   Fruits  Veggies - Large quantities of antioxidant rich fruits & veggies; 6 or more servings  - Vegetables can be eaten raw or lightly drizzled with oil and cooked  - Vegetables common to the traditional Mediterranean Diet include: artichokes, arugula, beets, broccoli, brussel sprouts, cabbage, carrots, celery, collard greens, cucumbers, eggplant, kale, leeks, lemons, lettuce, mushrooms, okra, onions, peas, peppers, potatoes, pumpkin, radishes, rutabaga, shallots, spinach, sweet potatoes, turnips, zucchini - Fruits common to the Mediterranean Diet include: apples, apricots, avocados, cherries, clementines, dates, figs, grapefruits, grapes, melons, nectarines, oranges, peaches, pears, pomegranates, strawberries, tangerines  Fats - Replace butter and margarine with healthy oils, such as olive oil, canola oil, and tahini  - Limit nuts to no more than a  handful a day  - Nuts include walnuts, almonds, pecans, pistachios, pine nuts  - Limit or avoid candied, honey roasted or heavily salted nuts - Olives are central to the [employer] - can be eaten whole or used in a variety of dishes   Meats Protein - Limiting red meat: no more than a few times a month - When eating red meat: choose lean cuts and keep the portion to the size of deck of cards - Eggs: approx. 0 to 4 times a week  - Fish and lean poultry: at least 2 a week  - Healthy protein sources include, chicken, [place], lean beef, lamb - Increase intake of seafood such as tuna, salmon, trout, mackerel, shrimp, scallops - Avoid or limit high fat processed meats such as sausage and bacon  Dairy - Include moderate amounts of low fat dairy products  - Focus on healthy dairy such as fat free yogurt, skim milk, low or reduced fat cheese - Limit dairy products higher in fat such as whole or 2% milk, cheese, ice cream  Alcohol - Moderate amounts of red wine is ok  - No more than 5 oz daily for women (all ages) and men older than age 56  - No more than 10 oz of wine daily for men younger than 36  Other - Limit sweets and other desserts  - Use herbs and spices instead of salt to flavor foods  - Herbs and spices common to the traditional Mediterranean Diet include: basil, bay leaves, chives, cloves, cumin, fennel, garlic, lavender, marjoram, mint, oregano, parsley, pepper, rosemary, sage, savory, sumac, tarragon, thyme   It's not just a diet, it's a lifestyle:  The Mediterranean diet includes lifestyle factors typical of those in the  region  Foods, drinks and meals are best eaten with others and savored Daily physical activity is important for overall good health This could be strenuous exercise like running and aerobics This could also be more leisurely activities such as walking, housework, yard-work, or taking the stairs Moderation is the key; a balanced and healthy diet accommodates most  foods and drinks Consider portion sizes and frequency of consumption of certain foods   Meal Ideas & Options:  Breakfast:  Whole wheat toast or whole wheat English muffins with peanut butter & hard boiled egg Steel cut oats topped with apples & cinnamon and skim milk  Fresh fruit: banana, strawberries, melon, berries, peaches  Smoothies: strawberries, bananas, greek yogurt, peanut butter Low fat greek yogurt with blueberries and granola  Egg white omelet with spinach and mushrooms Breakfast couscous: whole wheat couscous, apricots, skim milk, cranberries  Sandwiches:  Hummus and grilled vegetables (peppers, zucchini, squash) on whole wheat bread   Grilled chicken on whole wheat pita with lettuce, tomatoes, cucumbers or tzatziki  [place] salad on whole wheat bread: tuna salad made with greek yogurt, olives, red peppers, capers, green onions Garlic rosemary lamb pita: lamb sauted with garlic, rosemary, salt & pepper; add lettuce, cucumber, greek yogurt to pita - flavor with lemon juice and black pepper  Seafood:  Mediterranean grilled salmon, seasoned with garlic, basil, parsley, lemon juice and black pepper Shrimp, lemon, and spinach whole-grain pasta salad made with low fat greek yogurt  Seared scallops with lemon orzo  Seared tuna steaks seasoned salt, pepper, coriander topped with tomato mixture of olives, tomatoes, olive oil, minced garlic, parsley, green onions and cappers  Meats:  Herbed greek chicken salad with kalamata olives, cucumber, feta  Red bell peppers stuffed with spinach, bulgur, lean ground beef (or lentils) & topped with feta   Kebabs: skewers of chicken, tomatoes, onions, zucchini, squash  [place] burgers: made with red onions, mint, dill, lemon juice, feta cheese topped with roasted red peppers Vegetarian Cucumber salad: cucumbers, artichoke hearts, celery, red onion, feta cheese, tossed in olive oil & lemon juice  Hummus and whole grain pita points with a greek salad  (lettuce, tomato, feta, olives, cucumbers, red onion) Lentil soup with celery, carrots made with vegetable broth, garlic, salt and pepper  Tabouli salad: parsley, bulgur, mint, scallions, cucumbers, tomato, radishes, lemon juice, olive oil, salt and pepper.

## 2022-12-13 NOTE — Progress Notes (Signed)
Date:  12/13/2022   Name:  Cheryl Campos   DOB:  June 12, 1956   MRN:  [id number]   Chief Complaint: Hyperlipidemia, Hypothyroidism, Gastroesophageal Reflux, viral syndrome, and Colon Cancer Screening  Hyperlipidemia This is a chronic problem. The current episode started more than 1 year ago. The problem is controlled. Recent lipid tests were reviewed and are normal. She has no history of chronic renal disease, diabetes, hypothyroidism, liver disease, obesity or nephrotic syndrome. Pertinent negatives include no chest pain, focal weakness, myalgias or shortness of breath. Current antihyperlipidemic treatment includes statins. The current treatment provides moderate improvement of lipids.  Gastroesophageal Reflux She reports no abdominal pain, no chest pain, no coughing, no dysphagia, no heartburn, no nausea, no sore throat, no stridor or no wheezing. This is a chronic problem. The current episode started more than 1 year ago. The problem has been gradually improving. Associated symptoms include weight loss. Pertinent negatives include no fatigue. She has tried a PPI for the symptoms. The treatment provided moderate relief.  Thyroid Problem Presents for follow-up visit. Symptoms include cold intolerance and weight loss. Patient reports no anxiety, constipation, depressed mood, diarrhea, dry skin, fatigue, hair loss, menstrual problem, nail problem or palpitations. Her past medical history is significant for hyperlipidemia. There is no history of diabetes.    Lab Results  Component Value Date   NA 142 06/12/2022   K 4.7 06/12/2022   CO2 23 06/12/2022   GLUCOSE 118 (H) 06/12/2022   BUN 12 06/12/2022   CREATININE 0.67 06/12/2022   CALCIUM 9.1 06/12/2022   EGFR 98 06/12/2022   GFRNONAA 74 10/27/2020   Lab Results  Component Value Date   CHOL 171 12/13/2021   HDL 49 12/13/2021   LDLCALC 95 12/13/2021   TRIG 157 (H) 12/13/2021   CHOLHDL 4.0 09/11/2019   Lab Results  Component Value  Date   TSH 0.777 06/12/2022   No results found for: "HGBA1C" Lab Results  Component Value Date   WBC 12.4 (H) 03/14/2017   HGB 13.5 03/14/2017   HCT 41.8 03/14/2017   MCV 88.2 03/14/2017   PLT 232 03/14/2017   Lab Results  Component Value Date   ALT 17 12/13/2021   AST 15 12/13/2021   ALKPHOS 104 12/13/2021   BILITOT 0.5 12/13/2021   No results found for: "25OHVITD2", "25OHVITD3", "VD25OH"   Review of Systems  Constitutional:  Positive for weight loss. Negative for chills, fatigue, fever and unexpected weight change.  HENT:  Negative for congestion, ear discharge, ear pain, rhinorrhea, sinus pressure, sneezing and sore throat.   Respiratory:  Negative for cough, shortness of breath, wheezing and stridor.   Cardiovascular:  Negative for chest pain and palpitations.  Gastrointestinal:  Negative for abdominal pain, blood in stool, constipation, diarrhea, dysphagia, heartburn and nausea.  Endocrine: Positive for cold intolerance.  Genitourinary:  Negative for dysuria, flank pain, frequency, hematuria, menstrual problem, urgency and vaginal discharge.  Musculoskeletal:  Negative for arthralgias, back pain and myalgias.  Skin:  Negative for rash.  Neurological:  Negative for dizziness, focal weakness, weakness and headaches.  Hematological:  Negative for adenopathy. Does not bruise/bleed easily.  Psychiatric/Behavioral:  Negative for dysphoric mood. The patient is not nervous/anxious.     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   Abnormal carotid pulse 08/27/2017   History of colon polyps    Rectal polyp    Esophageal dysphagia    Gastritis without bleeding    Tremor  06/11/2016   GERD (gastroesophageal reflux disease) 06/11/2016   Hyperlipidemia 06/11/2016   Fever blister 06/11/2016   Thyroid activity decreased 06/11/2016   EPIGASTRIC PAIN 04/20/2009   NONSPECIFIC ABN FINDING RAD & OTH EXAM GI TRACT 04/20/2009    No Known  Allergies  Past Surgical History:  Procedure Laterality Date   ADENOIDECTOMY     COLONOSCOPY  2008   [place]   COLONOSCOPY WITH PROPOFOL N/A 07/29/2017   Procedure: COLONOSCOPY WITH PROPOFOL;  Surgeon: Lucilla Lame, MD;  Location: [place];  Service: Gastroenterology;  Laterality: N/A;   ESOPHAGOGASTRODUODENOSCOPY (EGD) WITH PROPOFOL N/A 07/29/2017   Procedure: ESOPHAGOGASTRODUODENOSCOPY (EGD) WITH PROPOFOL;  Surgeon: Lucilla Lame, MD;  Location: [place];  Service: Gastroenterology;  Laterality: N/A;  requests early   POLYPECTOMY  07/29/2017   Procedure: POLYPECTOMY;  Surgeon: Lucilla Lame, MD;  Location: [place];  Service: Gastroenterology;;    Social History   Tobacco Use   Smoking status: Never   Smokeless tobacco: Never  Vaping Use   Vaping Use: Never used  Substance Use Topics   Alcohol use: No   Drug use: No     Medication list has been reviewed and updated.  Current Meds  Medication Sig   propranolol (INDERAL) 80 MG tablet Take 1 tablet (80 mg total) by mouth 2 (two) times daily. Dr Manuella Ghazi   [DISCONTINUED] atorvastatin (LIPITOR) 10 MG tablet TAKE (1) TABLET BY MOUTH EVERY DAY   [DISCONTINUED] levothyroxine (SYNTHROID) 137 MCG tablet TAKE (1) TABLET BY MOUTH EVERY DAY   [DISCONTINUED] omeprazole (PRILOSEC) 40 MG capsule TAKE ONE (1) CAPSULE EACH DAY.   [DISCONTINUED] valACYclovir (VALTREX) 500 MG tablet TAKE (1) TABLET BY MOUTH TWICE DAILY AS NEEDED FOR FLARE UP       12/13/2022    9:45 AM 06/12/2022    9:29 AM 12/13/2021    9:20 AM 06/12/2021    9:36 AM  GAD 7 : Generalized Anxiety Score  Nervous, Anxious, on Edge 0 0 0 0  Control/stop worrying 0 0 0 0  Worry too much - different things 0 1 0 0  Trouble relaxing 0 0 0 0  Restless 0 0 0 0  Easily annoyed or irritable 0 0 0 0  Afraid - awful might happen 0 0 0 0  Total GAD 7 Score 0 1 0 0  Anxiety Difficulty Not difficult at all Not difficult at all Not difficult at all         12/13/2022    9:45 AM 06/12/2022    9:29 AM 12/13/2021    9:20 AM  Depression screen PHQ 2/9  Decreased Interest 0 0 0  Down, Depressed, Hopeless 0 1 0  PHQ - 2 Score 0 1 0  Altered sleeping 0 0 0  Tired, decreased energy 0 0 0  Change in appetite 0 0 0  Feeling bad or failure about yourself  0 0 0  Trouble concentrating 0 0 0  Moving slowly or fidgety/restless 0 0 0  Suicidal thoughts 0 0 0  PHQ-9 Score 0 1 0  Difficult doing work/chores Not difficult at all Not difficult at all Not difficult at all    BP Readings from Last 3 Encounters:  12/13/22 120/70  06/12/22 130/80  12/13/21 110/72    Physical Exam Vitals and nursing note reviewed. Exam conducted with a chaperone present.  Constitutional:      General: She is not in acute distress.    Appearance: She is not diaphoretic.  HENT:  Head: Normocephalic and atraumatic.     Right Ear: Tympanic membrane and external ear normal.     Left Ear: Tympanic membrane and external ear normal.     Nose: Nose normal. No congestion or rhinorrhea.     Mouth/Throat:     Mouth: Mucous membranes are moist.  Eyes:     General:        Right eye: No discharge.        Left eye: No discharge.     Conjunctiva/sclera: Conjunctivae normal.     Pupils: Pupils are equal, round, and reactive to light.  Neck:     Thyroid: No thyromegaly.     Vascular: No JVD.  Cardiovascular:     Rate and Rhythm: Normal rate and regular rhythm.     Heart sounds: Normal heart sounds. No murmur heard.    No friction rub. No gallop.  Pulmonary:     Effort: Pulmonary effort is normal.     Breath sounds: Normal breath sounds. No wheezing, rhonchi or rales.  Abdominal:     General: Bowel sounds are normal.     Palpations: Abdomen is soft. There is no mass.     Tenderness: There is no abdominal tenderness. There is no guarding.  Musculoskeletal:        General: Normal range of motion.     Cervical back: Normal range of motion and neck supple.   Lymphadenopathy:     Cervical: No cervical adenopathy.  Skin:    General: Skin is warm and dry.  Neurological:     Mental Status: She is alert.     Deep Tendon Reflexes: Reflexes are normal and symmetric.     Wt Readings from Last 3 Encounters:  12/13/22 189 lb 9.6 oz (86 kg)  06/12/22 212 lb (96.2 kg)  12/13/21 215 lb (97.5 kg)    BP 120/70   Pulse 60   Ht 5' 6"$  (1.676 m)   Wt 189 lb 9.6 oz (86 kg)   SpO2 96%   BMI 30.60 kg/m   Assessment and Plan:  1. Mixed hyperlipidemia Chronic.  Controlled.  Stable.  Continue atorvastatin 10 mg once a day.  Will check comprehensive metabolic panel for transaminases and lipid panel for current level of control. - atorvastatin (LIPITOR) 10 MG tablet; TAKE (1) TABLET BY MOUTH EVERY DAY  Dispense: 90 tablet; Refill: 1 - Comprehensive Metabolic Panel (CMET) - Lipid Panel With LDL/HDL Ratio  2. Hypothyroidism, unspecified type Chronic.  Controlled.  Stable.  Currently on levothyroxine 137 mcg daily.  Will check thyroid panel with TSH for current level of control management. - levothyroxine (SYNTHROID) 137 MCG tablet; TAKE (1) TABLET BY MOUTH EVERY DAY  Dispense: 90 tablet; Refill: 1 - Thyroid Panel With TSH  3. Gastroesophageal reflux disease, unspecified whether esophagitis present Chronic.  Controlled.  Stable.  Works when taken in patient's been encouraged to continue omeprazole 40 mg once a day. - omeprazole (PRILOSEC) 40 MG capsule; TAKE ONE (1) CAPSULE EACH DAY.  Dispense: 90 capsule; Refill: 1  4. Stomatitis herpetiformis Chronic.  Controlled.  No episodes have been noted but it is nice to have medication in case of a flare and patient will have this refilled valacyclovir 500 mg twice a day as needed flare. - valACYclovir (VALTREX) 500 MG tablet; TAKE (1) TABLET BY MOUTH TWICE DAILY AS NEEDED FOR FLARE UP  Dispense: 30 tablet; Refill: 5  5. Colon cancer screening Discussed and screening for colonoscopy with referral for  colonoscopy. -  Ambulatory referral to Gastroenterology    Otilio Miu, MD

## 2022-12-14 ENCOUNTER — Other Ambulatory Visit: Payer: Self-pay

## 2022-12-14 DIAGNOSIS — E039 Hypothyroidism, unspecified: Secondary | ICD-10-CM

## 2022-12-14 LAB — LIPID PANEL WITH LDL/HDL RATIO
Cholesterol, Total: 167 mg/dL (ref 100–199)
HDL: 48 mg/dL (ref >39)
LDL Chol Calc (NIH): 97 mg/dL (ref 0–99)
LDL/HDL Ratio: 2 ratio (ref 0.0–3.2)
Triglycerides: 126 mg/dL (ref 0–149)
VLDL Cholesterol Cal: 22 mg/dL (ref 5–40)

## 2022-12-14 LAB — COMPREHENSIVE METABOLIC PANEL
ALT: 14 IU/L (ref 0–32)
AST: 14 IU/L (ref 0–40)
Albumin/Globulin Ratio: 1.6 (ref 1.2–2.2)
Albumin: 4.4 g/dL (ref 3.9–4.9)
Alkaline Phosphatase: 112 IU/L (ref 44–121)
BUN/Creatinine Ratio: 19 (ref 12–28)
BUN: 14 mg/dL (ref 8–27)
Bilirubin Total: 0.6 mg/dL (ref 0.0–1.2)
CO2: 23 mmol/L (ref 20–29)
Calcium: 9.3 mg/dL (ref 8.7–10.3)
Chloride: 100 mmol/L (ref 96–106)
Creatinine, Ser: 0.73 mg/dL (ref 0.57–1.00)
Globulin, Total: 2.7 g/dL (ref 1.5–4.5)
Glucose: 100 mg/dL — ABNORMAL HIGH (ref 70–99)
Potassium: 4.9 mmol/L (ref 3.5–5.2)
Sodium: 141 mmol/L (ref 134–144)
Total Protein: 7.1 g/dL (ref 6.0–8.5)
eGFR: 91 mL/min/{1.73_m2} (ref >59)

## 2022-12-14 LAB — THYROID PANEL WITH TSH
Free Thyroxine Index: 4.4 (ref 1.2–4.9)
T3 Uptake Ratio: 35 % (ref 24–39)
T4, Total: 12.6 ug/dL — ABNORMAL HIGH (ref 4.5–12.0)
TSH: 0.146 u[IU]/mL — ABNORMAL LOW (ref 0.450–4.500)

## 2022-12-14 MED ORDER — LEVOTHYROXINE SODIUM 125 MCG PO TABS: 125.0000 ug | ORAL_TABLET | Freq: Every day | ORAL | 0 refills | Status: DC

## 2022-12-14 NOTE — Progress Notes (Signed)
Sent in new levo dose

## 2022-12-20 ENCOUNTER — Encounter: Payer: Self-pay | Admitting: *Deleted

## 2023-02-06 ENCOUNTER — Ambulatory Visit (INDEPENDENT_AMBULATORY_CARE_PROVIDER_SITE_OTHER): Payer: Medicare HMO

## 2023-02-06 VITALS — Ht 66.0 in | Wt 189.0 lb

## 2023-02-06 DIAGNOSIS — Z0000 Encounter for general adult medical examination without abnormal findings: Secondary | ICD-10-CM

## 2023-02-06 DIAGNOSIS — Z780 Asymptomatic menopausal state: Secondary | ICD-10-CM

## 2023-02-06 DIAGNOSIS — Z1211 Encounter for screening for malignant neoplasm of colon: Secondary | ICD-10-CM

## 2023-02-06 NOTE — Progress Notes (Signed)
I connected with  Cheryl Campos on 02/06/23 by a audio enabled telemedicine application and verified that I am speaking with the correct person using two identifiers.  Patient Location: Home  Provider Location: Office/Clinic  I discussed the limitations of evaluation and management by telemedicine. The patient expressed understanding and agreed to proceed.  Subjective:   Cheryl Campos is a 67 y.o. female who presents for an Initial Medicare Annual Wellness Visit.  Review of Systems     Cardiac Risk Factors include: advanced age (>57men, >50 women)     Objective:    There were no vitals filed for this visit. There is no height or weight on file to calculate BMI.     02/06/2023    2:37 PM 08/26/2017   10:15 AM 07/29/2017    7:36 AM 03/13/2017   11:23 PM 07/25/2015   10:08 AM  Advanced Directives  Does Patient Have a Medical Advance Directive? No Yes Yes No No  Type of Special educational needs teacher of [place];Living will Healthcare Power of Attorney    Does patient want to make changes to medical advance directive?   No - Patient declined    Copy of Healthcare Power of Attorney in Chart?   No - copy requested    Would patient like information on creating a medical advance directive? No - Patient declined    No - patient declined information    Current Medications (verified) Outpatient Encounter Medications as of 02/06/2023  Medication Sig   atorvastatin (LIPITOR) 10 MG tablet TAKE (1) TABLET BY MOUTH EVERY DAY   levothyroxine (SYNTHROID) 125 MCG tablet Take 1 tablet (125 mcg total) by mouth daily.   omeprazole (PRILOSEC) 40 MG capsule TAKE ONE (1) CAPSULE EACH DAY.   propranolol (INDERAL) 80 MG tablet Take 1 tablet (80 mg total) by mouth 2 (two) times daily. Dr Sherryll Burger   valACYclovir (VALTREX) 500 MG tablet TAKE (1) TABLET BY MOUTH TWICE DAILY AS NEEDED FOR FLARE UP   No facility-administered encounter medications on file as of 02/06/2023.    Allergies  (verified) Patient has no known allergies.   History: Past Medical History:  Diagnosis Date   Dystonic tremor    head, neck   GERD (gastroesophageal reflux disease)    Hypercholesteremia    Thyroid disease    Tremors of nervous system    Past Surgical History:  Procedure Laterality Date   ADENOIDECTOMY     COLONOSCOPY  2008   [place]   COLONOSCOPY WITH PROPOFOL N/A 07/29/2017   Procedure: COLONOSCOPY WITH PROPOFOL;  Surgeon: Midge Minium, MD;  [facility];  Service: Gastroenterology;  Laterality: N/A;   ESOPHAGOGASTRODUODENOSCOPY (EGD) WITH PROPOFOL N/A 07/29/2017   Procedure: ESOPHAGOGASTRODUODENOSCOPY (EGD) WITH PROPOFOL;  Surgeon: Midge Minium, MD;  Location: [facility];  Service: Gastroenterology;  Laterality: N/A;  requests early   POLYPECTOMY  07/29/2017   Procedure: POLYPECTOMY;  Surgeon: Midge Minium, MD;  Location: [facility];  Service: Gastroenterology;;   Family History  Problem Relation Age of Onset   Cancer Father    Heart disease Father    Cancer Brother    Diabetes Paternal Grandmother    Lung cancer Sister    Breast cancer Neg Hx    Social History   Socioeconomic History   Marital status: Married    Spouse name: Not on file   Number of children: Not on file   Years of education: Not on file   Highest education level: Not on  file  Occupational History   Not on file  Tobacco Use   Smoking status: Never   Smokeless tobacco: Never  Vaping Use   Vaping Use: Never used  Substance and Sexual Activity   Alcohol use: No   Drug use: No   Sexual activity: Not Currently    Birth control/protection: Post-menopausal  Other Topics Concern   Not on file  Social History Narrative   Not on file   Social Determinants of Health   Financial Resource Strain: Low Risk  (02/06/2023)   Overall Financial Resource Strain (CARDIA)    Difficulty of Paying Living Expenses: Not hard at all  Food Insecurity: No Food Insecurity  (02/06/2023)   Hunger Vital Sign    Worried About Running Out of Food in the Last Year: Never true    Ran Out of Food in the Last Year: Never true  Transportation Needs: No Transportation Needs (02/06/2023)   PRAPARE - Administrator, Civil Service (Medical): No    Lack of Transportation (Non-Medical): No  Physical Activity: Sufficiently Active (02/06/2023)   Exercise Vital Sign    Days of Exercise per Week: 5 days    Minutes of Exercise per Session: 60 min  Stress: No Stress Concern Present (02/06/2023)   [employer] of Occupational Health - Occupational Stress Questionnaire    Feeling of Stress : Only a little  Social Connections: Moderately Integrated (02/06/2023)   Social Connection and Isolation Panel [NHANES]    Frequency of Communication with Friends and Family: More than three times a week    Frequency of Social Gatherings with Friends and Family: More than three times a week    Attends Religious Services: More than 4 times per year    Active Member of [employer] or Organizations: No    Attends Engineer, structural: Never    Marital Status: Married    Tobacco Counseling Counseling given: Not Answered   Clinical Intake:  Pre-visit preparation completed: Yes  Pain : No/denies pain     Nutritional Risks: None Diabetes: No  How often do you need to have someone help you when you read instructions, pamphlets, or other written materials from your doctor or pharmacy?: 1 - Never  Diabetic?no  Interpreter Needed?: No  Information entered by :: Kennedy Bucker, LPN   Activities of Daily Living    02/06/2023    2:38 PM  In your present state of health, do you have any difficulty performing the following activities:  Hearing? 0  Vision? 0  Difficulty concentrating or making decisions? 0  Walking or climbing stairs? 0  Dressing or bathing? 0  Doing errands, shopping? 0  Preparing Food and eating ? N  Using the Toilet? N  In the past six months,  have you accidently leaked urine? N  Do you have problems with loss of bowel control? N  Managing your Medications? N  Managing your Finances? N  Housekeeping or managing your Housekeeping? N    Patient Care Team: Duanne Limerick, MD as PCP - General (Family Medicine)  Indicate any recent Medical Services you may have received from other than [facility] providers in the past year (date may be approximate).     Assessment:   This is a routine wellness examination for Cheryl Campos.  Hearing/Vision screen Hearing Screening - Comments:: No aids Vision Screening - Comments:: Wears contacts- Dr.Jackson  Dietary issues and exercise activities discussed: Current Exercise Habits: Home exercise routine, Type of exercise: walking, Time (Minutes): 60, Frequency (  Times/Week): 5, Weekly Exercise (Minutes/Week): 300, Intensity: Mild, Exercise limited by: None identified   Goals Addressed             This Visit's Progress    DIET - EAT MORE FRUITS AND VEGETABLES         Depression Screen    02/06/2023    2:36 PM 12/13/2022    9:45 AM 06/12/2022    9:29 AM 12/13/2021    9:20 AM 06/12/2021    9:36 AM 10/27/2020    4:12 PM 02/05/2019    2:22 PM  PHQ 2/9 Scores  PHQ - 2 Score 0 0 1 0 0 0 0  PHQ- 9 Score 0 0 1 0 0 0 1    Fall Risk    02/06/2023    2:38 PM 12/13/2022    9:45 AM 06/12/2022    9:29 AM 10/27/2020    4:12 PM 05/30/2017    8:18 AM  Fall Risk   Falls in the past year? 0 0 0 0 No  Number falls in past yr: 0 0 0    Injury with Fall? 0 0 0    Risk for fall due to : No Fall Risks No Fall Risks No Fall Risks    Follow up Falls prevention discussed;Falls evaluation completed Falls evaluation completed Falls evaluation completed Falls evaluation completed     FALL RISK PREVENTION PERTAINING TO THE HOME:  Any stairs in or around the home? Yes  If so, are there any without handrails? No  Home free of loose throw rugs in walkways, pet beds, electrical cords, etc? Yes  Adequate lighting in  your home to reduce risk of falls? Yes   ASSISTIVE DEVICES UTILIZED TO PREVENT FALLS:  Life alert? No  Use of a cane, walker or w/c? No  Grab bars in the bathroom? No  Shower chair or bench in shower? Yes  Elevated toilet seat or a handicapped toilet? Yes    Cognitive Function:        02/06/2023    2:44 PM  6CIT Screen  What Year? 0 points  What month? 0 points  What time? 0 points  Count back from 20 0 points  Months in reverse 0 points  Repeat phrase 0 points  Total Score 0 points    Immunizations Immunization History  Administered Date(s) Administered   Influenza,inj,Quad PF,6+ Mos 07/18/2017, 09/11/2019   PFIZER Comirnaty(Gray Top)Covid-19 Tri-Sucrose Vaccine 06/29/2020   Tdap 03/17/2018    TDAP status: Up to date  Flu Vaccine status: Declined, Education has been provided regarding the importance of this vaccine but patient still declined. Advised may receive this vaccine at local pharmacy or Health Dept. Aware to provide a copy of the vaccination record if obtained from local pharmacy or Health Dept. Verbalized acceptance and understanding.  Pneumococcal vaccine status: Declined,  Education has been provided regarding the importance of this vaccine but patient still declined. Advised may receive this vaccine at local pharmacy or Health Dept. Aware to provide a copy of the vaccination record if obtained from local pharmacy or Health Dept. Verbalized acceptance and understanding.   Covid-19 vaccine status: Declined, Education has been provided regarding the importance of this vaccine but patient still declined. Advised may receive this vaccine at local pharmacy or Health Dept.or vaccine clinic. Aware to provide a copy of the vaccination record if obtained from local pharmacy or Health Dept. Verbalized acceptance and understanding.  Qualifies for Shingles Vaccine? Yes   Zostavax completed No  Shingrix Completed?: No.    Education has been provided regarding the  importance of this vaccine. Patient has been advised to call insurance company to determine out of pocket expense if they have not yet received this vaccine. Advised may also receive vaccine at local pharmacy or Health Dept. Verbalized acceptance and understanding.  Screening Tests Health Maintenance  Topic Date Due   COVID-19 Vaccine (2 - 2023-24 season) 06/29/2022   COLONOSCOPY (Pts 45-11yrs Insurance coverage will need to be confirmed)  07/29/2022   PAP SMEAR-Modifier  01/14/2023   Zoster Vaccines- Shingrix (1 of 2) 03/13/2023 (Originally 07/11/2007)   Pneumonia Vaccine 69+ Years old (1 of 1 - PCV) 12/14/2023 (Originally 07/10/2022)   INFLUENZA VACCINE  05/30/2023   Medicare Annual Wellness (AWV)  02/06/2024   MAMMOGRAM  07/16/2024   DTaP/Tdap/Td (2 - Td or Tdap) 03/17/2028   DEXA SCAN  Completed   Hepatitis C Screening  Completed   HPV VACCINES  Aged Out   HIV Screening  Discontinued    Health Maintenance  Health Maintenance Due  Topic Date Due   COVID-19 Vaccine (2 - 2023-24 season) 06/29/2022   COLONOSCOPY (Pts 45-27yrs Insurance coverage will need to be confirmed)  07/29/2022   PAP SMEAR-Modifier  01/14/2023    Colorectal cancer screening: Referral to GI placed 02/06/23. Pt aware the office will call re: appt.  Mammogram status: Completed 07/16/22. Repeat every year  Bone Density status: Completed 11/16/14. Results reflect: Bone density results: NORMAL. Repeat every 5 years.- referral sent  Lung Cancer Screening: (Low Dose CT Chest recommended if Age 33-80 years, 30 pack-year currently smoking OR have quit w/in 15years.) does not qualify.     Additional Screening:  Hepatitis C Screening: does qualify; Completed 03/17/18  Vision Screening: Recommended annual ophthalmology exams for early detection of glaucoma and other disorders of the eye. Is the patient up to date with their annual eye exam?  Yes  Who is the provider or what is the name of the office in which the  patient attends annual eye exams? Dr.Jackson If pt is not established with a provider, would they like to be referred to a provider to establish care? No .   Dental Screening: Recommended annual dental exams for proper oral hygiene  Community Resource Referral / Chronic Care Management: CRR required this visit?  No   CCM required this visit?  No      Plan:     I have personally reviewed and noted the following in the patient's chart:   Medical and social history Use of alcohol, tobacco or illicit drugs  Current medications and supplements including opioid prescriptions. Patient is not currently taking opioid prescriptions. Functional ability and status Nutritional status Physical activity Advanced directives List of other physicians Hospitalizations, surgeries, and ER visits in previous 12 months Vitals Screenings to include cognitive, depression, and falls Referrals and appointments  In addition, I have reviewed and discussed with patient certain preventive protocols, quality metrics, and best practice recommendations. A written personalized care plan for preventive services as well as general preventive health recommendations were provided to patient.     Hal Hope, LPN   [id number]   Nurse Notes: none

## 2023-02-06 NOTE — Patient Instructions (Signed)
[name] , Thank you for taking time to come for your Medicare Wellness Visit. I appreciate your ongoing commitment to your health goals. Please review the following plan we discussed and let me know if I can assist you in the future.   These are the goals we discussed:  Goals      DIET - EAT MORE FRUITS AND VEGETABLES        This is a list of the screening recommended for you and due dates:  Health Maintenance  Topic Date Due   COVID-19 Vaccine (2 - 2023-24 season) 06/29/2022   Colon Cancer Screening  07/29/2022   Pap Smear  01/14/2023   Zoster (Shingles) Vaccine (1 of 2) 03/13/2023*   Pneumonia Vaccine (1 of 1 - PCV) 12/14/2023*   Flu Shot  05/30/2023   Medicare Annual Wellness Visit  02/06/2024   Mammogram  07/16/2024   DTaP/Tdap/Td vaccine (2 - Td or Tdap) 03/17/2028   DEXA scan (bone density measurement)  Completed   Hepatitis C Screening: USPSTF Recommendation to screen - Ages 41-79 yo.  Completed   HPV Vaccine  Aged Out   HIV Screening  Discontinued  *Topic was postponed. The date shown is not the original due date.    Advanced directives: no  Conditions/risks identified: none  Next appointment: Follow up in one year for your annual wellness visit 02/12/24 @ 11:15 am by phone   Preventive Care 65 Years and Older, Female Preventive care refers to lifestyle choices and visits with your health care provider that can promote health and wellness. What does preventive care include? A yearly physical exam. This is also called an annual well check. Dental exams once or twice a year. Routine eye exams. Ask your health care provider how often you should have your eyes checked. Personal lifestyle choices, including: Daily care of your teeth and gums. Regular physical activity. Eating a healthy diet. Avoiding tobacco and drug use. Limiting alcohol use. Practicing safe sex. Taking low-dose aspirin every day. Taking vitamin and mineral supplements as recommended by your  health care provider. What happens during an annual well check? The services and screenings done by your health care provider during your annual well check will depend on your age, overall health, lifestyle risk factors, and family history of disease. Counseling  Your health care provider may ask you questions about your: Alcohol use. Tobacco use. Drug use. Emotional well-being. Home and relationship well-being. Sexual activity. Eating habits. History of falls. Memory and ability to understand (cognition). Work and work Astronomer. Reproductive health. Screening  You may have the following tests or measurements: Height, weight, and BMI. Blood pressure. Lipid and cholesterol levels. These may be checked every 5 years, or more frequently if you are over 24 years old. Skin check. Lung cancer screening. You may have this screening every year starting at age 28 if you have a 30-pack-year history of smoking and currently smoke or have quit within the past 15 years. Fecal occult blood test (FOBT) of the stool. You may have this test every year starting at age 27. Flexible sigmoidoscopy or colonoscopy. You may have a sigmoidoscopy every 5 years or a colonoscopy every 10 years starting at age 41. Hepatitis C blood test. Hepatitis B blood test. Sexually transmitted disease (STD) testing. Diabetes screening. This is done by checking your blood sugar (glucose) after you have not eaten for a while (fasting). You may have this done every 1-3 years. Bone density scan. This is done to screen for osteoporosis.  You may have this done starting at age 70. Mammogram. This may be done every 1-2 years. Talk to your health care provider about how often you should have regular mammograms. Talk with your health care provider about your test results, treatment options, and if necessary, the need for more tests. Vaccines  Your health care provider may recommend certain vaccines, such as: Influenza vaccine.  This is recommended every year. Tetanus, diphtheria, and acellular pertussis (Tdap, Td) vaccine. You may need a Td booster every 10 years. Zoster vaccine. You may need this after age 57. Pneumococcal 13-valent conjugate (PCV13) vaccine. One dose is recommended after age 74. Pneumococcal polysaccharide (PPSV23) vaccine. One dose is recommended after age 45. Talk to your health care provider about which screenings and vaccines you need and how often you need them. This information is not intended to replace advice given to you by your health care provider. Make sure you discuss any questions you have with your health care provider. Document Released: 11/11/2015 Document Revised: 07/04/2016 Document Reviewed: 08/16/2015 Elsevier Interactive Patient Education  2017 [place] Prevention in the Home Falls can cause injuries. They can happen to people of all ages. There are many things you can do to make your home safe and to help prevent falls. What can I do on the outside of my home? Regularly fix the edges of walkways and driveways and fix any cracks. Remove anything that might make you trip as you walk through a door, such as a raised step or threshold. Trim any bushes or trees on the path to your home. Use bright outdoor lighting. Clear any walking paths of anything that might make someone trip, such as rocks or tools. Regularly check to see if handrails are loose or broken. Make sure that both sides of any steps have handrails. Any raised decks and porches should have guardrails on the edges. Have any leaves, snow, or ice cleared regularly. Use sand or salt on walking paths during winter. Clean up any spills in your garage right away. This includes oil or grease spills. What can I do in the bathroom? Use night lights. Install grab bars by the toilet and in the tub and shower. Do not use towel bars as grab bars. Use non-skid mats or decals in the tub or shower. If you need to sit  down in the shower, use a plastic, non-slip stool. Keep the floor dry. Clean up any water that spills on the floor as soon as it happens. Remove soap buildup in the tub or shower regularly. Attach bath mats securely with double-sided non-slip rug tape. Do not have throw rugs and other things on the floor that can make you trip. What can I do in the bedroom? Use night lights. Make sure that you have a light by your bed that is easy to reach. Do not use any sheets or blankets that are too big for your bed. They should not hang down onto the floor. Have a firm chair that has side arms. You can use this for support while you get dressed. Do not have throw rugs and other things on the floor that can make you trip. What can I do in the kitchen? Clean up any spills right away. Avoid walking on wet floors. Keep items that you use a lot in easy-to-reach places. If you need to reach something above you, use a strong step stool that has a grab bar. Keep electrical cords out of the way. Do not use  floor polish or wax that makes floors slippery. If you must use wax, use non-skid floor wax. Do not have throw rugs and other things on the floor that can make you trip. What can I do with my stairs? Do not leave any items on the stairs. Make sure that there are handrails on both sides of the stairs and use them. Fix handrails that are broken or loose. Make sure that handrails are as long as the stairways. Check any carpeting to make sure that it is firmly attached to the stairs. Fix any carpet that is loose or worn. Avoid having throw rugs at the top or bottom of the stairs. If you do have throw rugs, attach them to the floor with carpet tape. Make sure that you have a light switch at the top of the stairs and the bottom of the stairs. If you do not have them, ask someone to add them for you. What else can I do to help prevent falls? Wear shoes that: Do not have high heels. Have rubber bottoms. Are  comfortable and fit you well. Are closed at the toe. Do not wear sandals. If you use a stepladder: Make sure that it is fully opened. Do not climb a closed stepladder. Make sure that both sides of the stepladder are locked into place. Ask someone to hold it for you, if possible. Clearly mark and make sure that you can see: Any grab bars or handrails. First and last steps. Where the edge of each step is. Use tools that help you move around (mobility aids) if they are needed. These include: Canes. Walkers. Scooters. Crutches. Turn on the lights when you go into a dark area. Replace any light bulbs as soon as they burn out. Set up your furniture so you have a clear path. Avoid moving your furniture around. If any of your floors are uneven, fix them. If there are any pets around you, be aware of where they are. Review your medicines with your doctor. Some medicines can make you feel dizzy. This can increase your chance of falling. Ask your doctor what other things that you can do to help prevent falls. This information is not intended to replace advice given to you by your health care provider. Make sure you discuss any questions you have with your health care provider. Document Released: 08/11/2009 Document Revised: 03/22/2016 Document Reviewed: 11/19/2014 Elsevier Interactive Patient Education  2017 [employer].

## 2023-02-18 ENCOUNTER — Encounter: Payer: Self-pay | Admitting: *Deleted

## 2023-03-04 ENCOUNTER — Other Ambulatory Visit: Payer: Self-pay | Admitting: Family Medicine

## 2023-03-04 DIAGNOSIS — E039 Hypothyroidism, unspecified: Secondary | ICD-10-CM

## 2023-03-05 ENCOUNTER — Ambulatory Visit
Admission: RE | Admit: 2023-03-05 | Discharge: 2023-03-05 | Disposition: A | Discharge status: Home / Self Care | Payer: Medicare HMO | Source: Ambulatory Visit | Attending: Family Medicine | Admitting: Family Medicine

## 2023-03-05 ENCOUNTER — Other Ambulatory Visit: Payer: Self-pay | Admitting: Family Medicine

## 2023-03-05 DIAGNOSIS — E039 Hypothyroidism, unspecified: Secondary | ICD-10-CM

## 2023-03-05 DIAGNOSIS — Z780 Asymptomatic menopausal state: Secondary | ICD-10-CM | POA: Insufficient documentation

## 2023-03-05 NOTE — Telephone Encounter (Signed)
Requested medications are due for refill today.  yes  Requested medications are on the active medications list.  yes  Last refill. 12/14/2022 #90 1OX  Future visit scheduled.   yes  Notes to clinic.  Abnormal labs.    Requested Prescriptions  Pending Prescriptions Disp Refills   levothyroxine (SYNTHROID) 125 MCG tablet [Pharmacy Med Name: LEVOTHYROXIN TAB 125MCG] 90 tablet 0    Sig: TAKE 1 TABLET ( TOTAL) BY MOUTH DAILY     Endocrinology:  Hypothyroid Agents Failed - 03/05/2023 12:01 PM      Failed - TSH in normal range and within 360 days    TSH  Date Value Ref Range Status  12/13/2022 0.146 (L) 0.450 - 4.500 uIU/mL Final         Passed - Valid encounter within last 12 months    Recent Outpatient Visits           2 months ago Mixed hyperlipidemia   [place] [facility]   8 months ago Breast cancer screening by mammogram   [facility]   1 year ago Hypothyroidism, unspecified type   [facility]   1 year ago Mixed hyperlipidemia   [place] [facility]   2 years ago Hypothyroidism, unspecified type   [facility]       Future Appointments             In 3 months Duanne Limerick, MD [facility] at [facility], [facility]

## 2023-03-15 ENCOUNTER — Other Ambulatory Visit: Payer: Self-pay

## 2023-03-15 ENCOUNTER — Other Ambulatory Visit: Payer: Self-pay | Admitting: Family Medicine

## 2023-03-15 DIAGNOSIS — E039 Hypothyroidism, unspecified: Secondary | ICD-10-CM

## 2023-03-15 MED ORDER — LEVOTHYROXINE SODIUM 125 MCG PO TABS
125.0000 ug | ORAL_TABLET | Freq: Every day | ORAL | 1 refills | Status: DC
Start: 2023-03-15 — End: 2023-05-10

## 2023-03-15 NOTE — Telephone Encounter (Signed)
Unable to refill per protocol, Rx request is too soon. Last refill 03/15/23 for 30 and 1 refill.  Requested Prescriptions  Pending Prescriptions Disp Refills   levothyroxine (SYNTHROID) 125 MCG tablet [Pharmacy Med Name: LEVOTHYROXIN TAB 125MCG] 90 tablet     Sig: TAKE 1 TABLET ( TOTAL) BY MOUTH DAILY     Endocrinology:  Hypothyroid Agents Failed - 03/15/2023  1:46 PM      Failed - TSH in normal range and within 360 days    TSH  Date Value Ref Range Status  12/13/2022 0.146 (L) 0.450 - 4.500 uIU/mL Final         Passed - Valid encounter within last 12 months    Recent Outpatient Visits           3 months ago Mixed hyperlipidemia   [place] [facility]   9 months ago Breast cancer screening by mammogram   [facility]   1 year ago Hypothyroidism, unspecified type   [facility]   1 year ago Mixed hyperlipidemia   [place] [facility]   2 years ago Hypothyroidism, unspecified type   [facility] at [facility]       Future Appointments             In 3 months Duanne Limerick, MD [facility] at [facility], [facility]

## 2023-05-10 ENCOUNTER — Other Ambulatory Visit: Payer: Self-pay | Admitting: Family Medicine

## 2023-05-10 DIAGNOSIS — E039 Hypothyroidism, unspecified: Secondary | ICD-10-CM

## 2023-05-10 NOTE — Telephone Encounter (Signed)
Requested Prescriptions  Pending Prescriptions Disp Refills   levothyroxine (SYNTHROID) 125 MCG tablet [Pharmacy Med Name: LEVOTHYROXIN TAB 125MCG] 90 tablet 0    Sig: TAKE 1 TABLET (125 MCG TOTAL) BY MOUTH DAILY BEFORE BREAKFAST.     Endocrinology:  Hypothyroid Agents Failed - 05/10/2023 12:38 PM      Failed - TSH in normal range and within 360 days    TSH  Date Value Ref Range Status  12/13/2022 0.146 (L) 0.450 - 4.500 uIU/mL Final         Passed - Valid encounter within last 12 months    Recent Outpatient Visits           4 months ago Mixed hyperlipidemia   [place] [facility]   11 months ago Breast cancer screening by mammogram   [facility]   1 year ago Hypothyroidism, unspecified type   [facility]   1 year ago Mixed hyperlipidemia   [place] [facility]   2 years ago Hypothyroidism, unspecified type   [facility] at [facility]       Future Appointments             In 1 month Duanne Limerick, MD [facility] at [facility], [facility]

## 2023-06-13 ENCOUNTER — Ambulatory Visit: Payer: Self-pay | Admitting: Family Medicine

## 2023-06-20 ENCOUNTER — Encounter: Payer: Self-pay | Admitting: Family Medicine

## 2023-06-20 ENCOUNTER — Ambulatory Visit (INDEPENDENT_AMBULATORY_CARE_PROVIDER_SITE_OTHER): Payer: Medicare HMO | Admitting: Family Medicine

## 2023-06-20 VITALS — BP 120/76 | HR 64 | Ht 66.0 in | Wt 201.0 lb

## 2023-06-20 DIAGNOSIS — E039 Hypothyroidism, unspecified: Secondary | ICD-10-CM | POA: Diagnosis not present

## 2023-06-20 DIAGNOSIS — K219 Gastro-esophageal reflux disease without esophagitis: Secondary | ICD-10-CM | POA: Diagnosis not present

## 2023-06-20 DIAGNOSIS — K120 Recurrent oral aphthae: Secondary | ICD-10-CM

## 2023-06-20 DIAGNOSIS — E782 Mixed hyperlipidemia: Secondary | ICD-10-CM | POA: Diagnosis not present

## 2023-06-20 MED ORDER — LEVOTHYROXINE SODIUM 137 MCG PO TABS: 137.0000 ug | ORAL_TABLET | Freq: Every day | ORAL | 1 refills | Status: DC

## 2023-06-20 MED ORDER — OMEPRAZOLE 40 MG PO CPDR
DELAYED_RELEASE_CAPSULE | ORAL | 1 refills | Status: DC
Start: 2023-06-20 — End: 2023-12-26

## 2023-06-20 MED ORDER — ATORVASTATIN CALCIUM 10 MG PO TABS: ORAL_TABLET | ORAL | 1 refills | Status: DC

## 2023-06-20 MED ORDER — VALACYCLOVIR HCL 500 MG PO TABS: ORAL_TABLET | ORAL | 5 refills | Status: DC

## 2023-06-20 NOTE — Progress Notes (Signed)
Date:  06/20/2023   Name:  Cheryl Campos   DOB:  07/24/1956   MRN:  [id number]   Chief Complaint: Hyperlipidemia, Hypothyroidism, Gastroesophageal Reflux, and viral syndrome  Hyperlipidemia This is a chronic problem. The current episode started more than 1 year ago. The problem is controlled. Recent lipid tests were reviewed and are normal. She has no history of chronic renal disease, diabetes, hypothyroidism, liver disease, obesity or nephrotic syndrome. There are no known factors aggravating her hyperlipidemia. Pertinent negatives include no chest pain, focal sensory loss, focal weakness, leg pain, myalgias or shortness of breath. Current antihyperlipidemic treatment includes statins. The current treatment provides moderate improvement of lipids. Risk factors for coronary artery disease include dyslipidemia and hypertension.  Gastroesophageal Reflux She reports no abdominal pain, no belching, no chest pain, no choking, no coughing, no dysphagia, no early satiety, no heartburn, no hoarse voice, no nausea, no sore throat or no wheezing. This is a chronic problem. The current episode started more than 1 year ago. The problem has been gradually improving. Nothing aggravates the symptoms. Pertinent negatives include no anemia, fatigue or weight loss. She has tried a PPI for the symptoms. The treatment provided moderate relief. Past procedures do not include an abdominal ultrasound, esophageal manometry or esophageal pH monitoring.  Thyroid Problem Presents for follow-up visit. Patient reports no anxiety, cold intolerance, constipation, depressed mood, diaphoresis, diarrhea, dry skin, fatigue, hair loss, heat intolerance, hoarse voice, leg swelling, menstrual problem, nail problem, palpitations, tremors, visual change, weight gain or weight loss. The symptoms have been stable. Her past medical history is significant for hyperlipidemia. There is no history of diabetes.    Lab Results  Component Value  Date   NA 141 12/13/2022   K 4.9 12/13/2022   CO2 23 12/13/2022   GLUCOSE 100 (H) 12/13/2022   BUN 14 12/13/2022   CREATININE 0.73 12/13/2022   CALCIUM 9.3 12/13/2022   EGFR 91 12/13/2022   GFRNONAA 74 10/27/2020   Lab Results  Component Value Date   CHOL 167 12/13/2022   HDL 48 12/13/2022   LDLCALC 97 12/13/2022   TRIG 126 12/13/2022   CHOLHDL 4.0 09/11/2019   Lab Results  Component Value Date   TSH 0.146 (L) 12/13/2022   No results found for: "HGBA1C" Lab Results  Component Value Date   WBC 12.4 (H) 03/14/2017   HGB 13.5 03/14/2017   HCT 41.8 03/14/2017   MCV 88.2 03/14/2017   PLT 232 03/14/2017   Lab Results  Component Value Date   ALT 14 12/13/2022   AST 14 12/13/2022   ALKPHOS 112 12/13/2022   BILITOT 0.6 12/13/2022   No results found for: "25OHVITD2", "25OHVITD3", "VD25OH"   Review of Systems  Constitutional:  Negative for diaphoresis, fatigue, weight gain and weight loss.  HENT:  Negative for hoarse voice and sore throat.   Respiratory:  Negative for cough, choking, shortness of breath and wheezing.   Cardiovascular:  Negative for chest pain and palpitations.  Gastrointestinal:  Negative for abdominal pain, constipation, diarrhea, dysphagia, heartburn and nausea.  Endocrine: Negative for cold intolerance and heat intolerance.  Genitourinary:  Negative for menstrual problem.  Musculoskeletal:  Negative for myalgias.  Neurological:  Negative for tremors and focal weakness.  Psychiatric/Behavioral:  The patient is not nervous/anxious.     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   Abnormal carotid pulse 08/27/2017   History of colon polyps    Rectal polyp  Esophageal dysphagia    Gastritis without bleeding    Tremor 06/11/2016   GERD (gastroesophageal reflux disease) 06/11/2016   Hyperlipidemia 06/11/2016   Fever blister 06/11/2016   Thyroid activity decreased 06/11/2016   EPIGASTRIC PAIN  04/20/2009   NONSPECIFIC ABN FINDING RAD & OTH EXAM GI TRACT 04/20/2009    No Known Allergies  Past Surgical History:  Procedure Laterality Date   ADENOIDECTOMY     COLONOSCOPY  2008   [place]   COLONOSCOPY WITH PROPOFOL N/A 07/29/2017   Procedure: COLONOSCOPY WITH PROPOFOL;  Surgeon: Midge Minium, MD;  Location: [facility];  Service: Gastroenterology;  Laterality: N/A;   ESOPHAGOGASTRODUODENOSCOPY (EGD) WITH PROPOFOL N/A 07/29/2017   Procedure: ESOPHAGOGASTRODUODENOSCOPY (EGD) WITH PROPOFOL;  Surgeon: Midge Minium, MD;  Location: [facility];  Service: Gastroenterology;  Laterality: N/A;  requests early   POLYPECTOMY  07/29/2017   Procedure: POLYPECTOMY;  Surgeon: Midge Minium, MD;  Location: [facility];  Service: Gastroenterology;;    Social History   Tobacco Use   Smoking status: Never   Smokeless tobacco: Never  Vaping Use   Vaping status: Never Used  Substance Use Topics   Alcohol use: No   Drug use: No     Medication list has been reviewed and updated.  Current Meds  Medication Sig   atorvastatin (LIPITOR) 10 MG tablet TAKE (1) TABLET BY MOUTH EVERY DAY   levothyroxine (SYNTHROID) 125 MCG tablet TAKE 1 TABLET (125 MCG TOTAL) BY MOUTH DAILY BEFORE BREAKFAST.   omeprazole (PRILOSEC) 40 MG capsule TAKE ONE (1) CAPSULE EACH DAY.   propranolol (INDERAL) 80 MG tablet Take 1 tablet (80 mg total) by mouth 2 (two) times daily. Dr Sherryll Burger   valACYclovir (VALTREX) 500 MG tablet TAKE (1) TABLET BY MOUTH TWICE DAILY AS NEEDED FOR FLARE UP       06/20/2023    8:29 AM 12/13/2022    9:45 AM 06/12/2022    9:29 AM 12/13/2021    9:20 AM  GAD 7 : Generalized Anxiety Score  Nervous, Anxious, on Edge 0 0 0 0  Control/stop worrying 0 0 0 0  Worry too much - different things 0 0 1 0  Trouble relaxing 0 0 0 0  Restless 0 0 0 0  Easily annoyed or irritable 0 0 0 0  Afraid - awful might happen 0 0 0 0  Total GAD 7 Score 0 0 1 0  Anxiety Difficulty Not difficult at  all Not difficult at all Not difficult at all Not difficult at all       06/20/2023    8:28 AM 02/06/2023    2:36 PM 12/13/2022    9:45 AM  Depression screen PHQ 2/9  Decreased Interest 0 0 0  Down, Depressed, Hopeless 0 0 0  PHQ - 2 Score 0 0 0  Altered sleeping 0 0 0  Tired, decreased energy 0 0 0  Change in appetite 0 0 0  Feeling bad or failure about yourself  0 0 0  Trouble concentrating 0 0 0  Moving slowly or fidgety/restless 0 0 0  Suicidal thoughts 0 0 0  PHQ-9 Score 0 0 0  Difficult doing work/chores Not difficult at all Not difficult at all Not difficult at all    BP Readings from Last 3 Encounters:  06/20/23 120/76  12/13/22 120/70  06/12/22 130/80    Physical Exam Vitals and nursing note reviewed. Exam conducted with a chaperone present.  Constitutional:      General: She  is not in acute distress.    Appearance: She is not diaphoretic.  HENT:     Head: Normocephalic and atraumatic.     Right Ear: Tympanic membrane and external ear normal.     Left Ear: Tympanic membrane and external ear normal.     Nose: Nose normal.     Mouth/Throat:     Pharynx: No oropharyngeal exudate or posterior oropharyngeal erythema.  Eyes:     General:        Right eye: No discharge.        Left eye: No discharge.     Conjunctiva/sclera: Conjunctivae normal.     Pupils: Pupils are equal, round, and reactive to light.  Neck:     Thyroid: No thyromegaly or thyroid tenderness.     Vascular: No JVD.  Cardiovascular:     Rate and Rhythm: Normal rate and regular rhythm.     Heart sounds: Normal heart sounds. No murmur heard.    No friction rub. No gallop.  Pulmonary:     Effort: Pulmonary effort is normal.     Breath sounds: No wheezing, rhonchi or rales.  Abdominal:     General: Bowel sounds are normal.     Palpations: Abdomen is soft. There is no mass.     Tenderness: There is no abdominal tenderness. There is no guarding.  Musculoskeletal:        General: Normal range of  motion.     Cervical back: Normal range of motion and neck supple.  Lymphadenopathy:     Cervical: No cervical adenopathy.     Right cervical: No superficial, deep or posterior cervical adenopathy.    Left cervical: No superficial, deep or posterior cervical adenopathy.  Skin:    General: Skin is warm and dry.     Findings: No bruising, erythema or lesion.  Neurological:     Mental Status: She is alert.     Deep Tendon Reflexes: Reflexes are normal and symmetric.     Wt Readings from Last 3 Encounters:  06/20/23 201 lb (91.2 kg)  02/06/23 189 lb (85.7 kg)  12/13/22 189 lb 9.6 oz (86 kg)    BP 120/76   Pulse 64   Ht 5\' 6"  (1.676 m)   Wt 201 lb (91.2 kg)   SpO2 97%   BMI 32.44 kg/m   Assessment and Plan:  1. Mixed hyperlipidemia Chronic.  Controlled.  Stable.  Continue atorvastatin 10 mg once a day. - atorvastatin (LIPITOR) 10 MG tablet; TAKE (1) TABLET BY MOUTH EVERY DAY  Dispense: 90 tablet; Refill: 1  2. Hypothyroidism, unspecified type Chronic.  Symptomatic.  Stable.  Patient felt better at the higher dosing of 137 mcg and we will check thyroid panel with TSH but will likely increase her back to the 137 and that she felt better and there was some weight gain as well on the 125 micrograms - Thyroid Panel With TSH  3. Gastroesophageal reflux disease, unspecified whether esophagitis present Chronic.  Controlled.  Stable.  Continue omeprazole 40 mg once a day. - omeprazole (PRILOSEC) 40 MG capsule; TAKE ONE (1) CAPSULE EACH DAY.  Dispense: 90 capsule; Refill: 1  4. Stomatitis herpetiformis Chronic.  Controlled.  Stable.  Patient on as-needed basis is able to control his episodic symptomatology with valacyclovir 500 mg twice a day as needed - valACYclovir (VALTREX) 500 MG tablet; TAKE (1) TABLET BY MOUTH TWICE DAILY AS NEEDED FOR FLARE UP  Dispense: 30 tablet; Refill: 5  Elizabeth Sauer, MD

## 2023-06-21 LAB — THYROID PANEL WITH TSH
Free Thyroxine Index: 3.2 (ref 1.2–4.9)
T3 Uptake Ratio: 29 % (ref 24–39)
T4, Total: 10.9 ug/dL (ref 4.5–12.0)
TSH: 1.3 u[IU]/mL (ref 0.450–4.500)

## 2023-08-05 ENCOUNTER — Other Ambulatory Visit: Payer: Self-pay | Admitting: Family Medicine

## 2023-08-05 DIAGNOSIS — E039 Hypothyroidism, unspecified: Secondary | ICD-10-CM

## 2023-08-05 NOTE — Telephone Encounter (Signed)
Called pt left VM to call back.  KP 

## 2023-11-27 ENCOUNTER — Other Ambulatory Visit: Payer: Self-pay | Admitting: Family Medicine

## 2023-11-27 DIAGNOSIS — R251 Tremor, unspecified: Secondary | ICD-10-CM

## 2023-12-26 ENCOUNTER — Ambulatory Visit: Payer: Medicare HMO | Admitting: Family Medicine

## 2023-12-26 ENCOUNTER — Encounter: Payer: Self-pay | Admitting: Family Medicine

## 2023-12-26 VITALS — BP 124/80 | HR 58 | Ht 66.0 in | Wt 198.4 lb

## 2023-12-26 DIAGNOSIS — R251 Tremor, unspecified: Secondary | ICD-10-CM

## 2023-12-26 DIAGNOSIS — K219 Gastro-esophageal reflux disease without esophagitis: Secondary | ICD-10-CM | POA: Diagnosis not present

## 2023-12-26 DIAGNOSIS — K120 Recurrent oral aphthae: Secondary | ICD-10-CM

## 2023-12-26 DIAGNOSIS — E782 Mixed hyperlipidemia: Secondary | ICD-10-CM

## 2023-12-26 DIAGNOSIS — E039 Hypothyroidism, unspecified: Secondary | ICD-10-CM

## 2023-12-26 MED ORDER — PROPRANOLOL HCL 80 MG PO TABS: 80.0000 mg | ORAL_TABLET | Freq: Two times a day (BID) | ORAL | 1 refills | Status: DC

## 2023-12-26 MED ORDER — OMEPRAZOLE 40 MG PO CPDR: DELAYED_RELEASE_CAPSULE | ORAL | 1 refills | Status: DC

## 2023-12-26 MED ORDER — LEVOTHYROXINE SODIUM 137 MCG PO TABS: 137.0000 ug | ORAL_TABLET | Freq: Every day | ORAL | 1 refills | Status: DC

## 2023-12-26 MED ORDER — VALACYCLOVIR HCL 500 MG PO TABS
ORAL_TABLET | ORAL | 5 refills | Status: AC
Start: 2023-12-26 — End: ?

## 2023-12-26 MED ORDER — ATORVASTATIN CALCIUM 10 MG PO TABS: ORAL_TABLET | ORAL | 1 refills | Status: DC

## 2023-12-26 NOTE — Patient Instructions (Signed)

## 2023-12-26 NOTE — Progress Notes (Signed)
 Date:  12/26/2023   Name:  Cheryl Campos   DOB:  [birth year]   MRN:  [id number]   Chief Complaint: Hypothyroidism, Hyperlipidemia, and Gastroesophageal Reflux  Hyperlipidemia This is a chronic problem. The current episode started more than 1 year ago. The problem is controlled. Recent lipid tests were reviewed and are normal. She has no history of chronic renal disease, diabetes, hypothyroidism, liver disease, obesity or nephrotic syndrome. There are no known factors aggravating her hyperlipidemia. Pertinent negatives include no chest pain, focal sensory loss, focal weakness, leg pain, myalgias or shortness of breath. Current antihyperlipidemic treatment includes statins. The current treatment provides moderate improvement of lipids. There are no compliance problems.  Risk factors for coronary artery disease include hypertension and dyslipidemia.  Gastroesophageal Reflux She reports no abdominal pain, no belching, no chest pain, no choking, no coughing, no dysphagia, no early satiety, no globus sensation, no heartburn, no hoarse voice, no nausea, no sore throat, no stridor, no tooth decay, no water brash or no wheezing. This is a chronic problem. The current episode started more than 1 year ago. The problem has been gradually improving. The symptoms are aggravated by certain foods. Pertinent negatives include no fatigue or weight loss. She has tried a PPI for the symptoms. The treatment provided moderate relief.  Neurologic Problem The patient's pertinent negatives include no altered mental status, clumsiness, focal sensory loss, focal weakness, loss of balance, memory loss, near-syncope, slurred speech, syncope, visual change or weakness. This is a chronic problem. The current episode started more than 1 year ago. The problem has been gradually improving since onset. Associated symptoms include a fever. Pertinent negatives include no abdominal pain, bladder incontinence, bowel incontinence, chest  pain, confusion, diaphoresis, fatigue, nausea, palpitations or shortness of breath. (tremor) Past treatments include nothing. The treatment provided moderate relief. There is no history of liver disease.  Thyroid Problem Presents for follow-up visit. Symptoms include tremors. Patient reports no anxiety, cold intolerance, constipation, depressed mood, diaphoresis, diarrhea, dry skin, fatigue, hair loss, heat intolerance, hoarse voice, leg swelling, menstrual problem, nail problem, palpitations, visual change, weight gain or weight loss. The symptoms have been stable. Her past medical history is significant for hyperlipidemia. There is no history of diabetes.    Lab Results  Component Value Date   NA 141 12/13/2022   K 4.9 12/13/2022   CO2 23 12/13/2022   GLUCOSE 100 (H) 12/13/2022   BUN 14 12/13/2022   CREATININE 0.73 12/13/2022   CALCIUM 9.3 12/13/2022   EGFR 91 12/13/2022   GFRNONAA 74 10/27/2020   Lab Results  Component Value Date   CHOL 167 12/13/2022   HDL 48 12/13/2022   LDLCALC 97 12/13/2022   TRIG 126 12/13/2022   CHOLHDL 4.0 09/11/2019   Lab Results  Component Value Date   TSH 1.300 06/20/2023   No results found for: "HGBA1C" Lab Results  Component Value Date   WBC 12.4 (H) 03/14/2017   HGB 13.5 03/14/2017   HCT 41.8 03/14/2017   MCV 88.2 03/14/2017   PLT 232 03/14/2017   Lab Results  Component Value Date   ALT 14 12/13/2022   AST 14 12/13/2022   ALKPHOS 112 12/13/2022   BILITOT 0.6 12/13/2022   No results found for: "25OHVITD2", "25OHVITD3", "VD25OH"   Review of Systems  Constitutional:  Positive for fever. Negative for diaphoresis, fatigue, weight gain and weight loss.  HENT:  Negative for hoarse voice, postnasal drip, rhinorrhea and sore throat.   Respiratory:  Negative for  cough, choking, shortness of breath and wheezing.   Cardiovascular:  Negative for chest pain, palpitations and near-syncope.  Gastrointestinal:  Negative for abdominal pain, bowel  incontinence, constipation, diarrhea, dysphagia, heartburn and nausea.  Endocrine: Negative for cold intolerance and heat intolerance.  Genitourinary:  Negative for bladder incontinence and menstrual problem.  Musculoskeletal:  Negative for myalgias.  Neurological:  Positive for tremors. Negative for focal weakness, syncope, weakness and loss of balance.  Psychiatric/Behavioral:  Negative for confusion and memory loss. The patient is not nervous/anxious.     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   Abnormal carotid pulse 08/27/2017   History of colon polyps    Rectal polyp    Esophageal dysphagia    Gastritis without bleeding    Tremor 06/11/2016   GERD (gastroesophageal reflux disease) 06/11/2016   Hyperlipidemia 06/11/2016   Fever blister 06/11/2016   Thyroid activity decreased 06/11/2016   EPIGASTRIC PAIN 04/20/2009   NONSPECIFIC ABN FINDING RAD & OTH EXAM GI TRACT 04/20/2009    No Known Allergies  Past Surgical History:  Procedure Laterality Date   ADENOIDECTOMY     COLONOSCOPY  2008   [place]   COLONOSCOPY WITH PROPOFOL N/A 07/29/2017   Procedure: COLONOSCOPY WITH PROPOFOL;  Surgeon: Midge Minium, MD;  Location: [facility];  Service: Gastroenterology;  Laterality: N/A;   ESOPHAGOGASTRODUODENOSCOPY (EGD) WITH PROPOFOL N/A 07/29/2017   Procedure: ESOPHAGOGASTRODUODENOSCOPY (EGD) WITH PROPOFOL;  Surgeon: Midge Minium, MD;  Location: [facility];  Service: Gastroenterology;  Laterality: N/A;  requests early   POLYPECTOMY  07/29/2017   Procedure: POLYPECTOMY;  Surgeon: Midge Minium, MD;  Location: [facility];  Service: Gastroenterology;;    Social History   Tobacco Use   Smoking status: Never   Smokeless tobacco: Never  Vaping Use   Vaping status: Never Used  Substance Use Topics   Alcohol use: No   Drug use: No     Medication list has been reviewed and updated.  Current Meds  Medication  Sig   atorvastatin (LIPITOR) 10 MG tablet TAKE (1) TABLET BY MOUTH EVERY DAY   levothyroxine (SYNTHROID) 137 MCG tablet Take 1 tablet (137 mcg total) by mouth daily before breakfast.   omeprazole (PRILOSEC) 40 MG capsule TAKE ONE (1) CAPSULE EACH DAY.   propranolol (INDERAL) 80 MG tablet TAKE ONE (1) TABLET (80 MG TOTAL) BY MOUTH TWO (2) (TWO) TIMES DAILY FOR 30 DAYS   valACYclovir (VALTREX) 500 MG tablet TAKE (1) TABLET BY MOUTH TWICE DAILY AS NEEDED FOR FLARE UP       12/26/2023    9:06 AM 06/20/2023    8:29 AM 12/13/2022    9:45 AM 06/12/2022    9:29 AM  GAD 7 : Generalized Anxiety Score  Nervous, Anxious, on Edge 0 0 0 0  Control/stop worrying 0 0 0 0  Worry too much - different things 1 0 0 1  Trouble relaxing 0 0 0 0  Restless 0 0 0 0  Easily annoyed or irritable 0 0 0 0  Afraid - awful might happen 0 0 0 0  Total GAD 7 Score 1 0 0 1  Anxiety Difficulty Not difficult at all Not difficult at all Not difficult at all Not difficult at all       12/26/2023    9:06 AM 06/20/2023    8:28 AM 02/06/2023    2:36 PM  Depression screen PHQ 2/9  Decreased Interest 0 0 0  Down,  Depressed, Hopeless 0 0 0  PHQ - 2 Score 0 0 0  Altered sleeping 1 0 0  Tired, decreased energy 0 0 0  Change in appetite 0 0 0  Feeling bad or failure about yourself  0 0 0  Trouble concentrating 0 0 0  Moving slowly or fidgety/restless 0 0 0  Suicidal thoughts 0 0 0  PHQ-9 Score 1 0 0  Difficult doing work/chores Not difficult at all Not difficult at all Not difficult at all    BP Readings from Last 3 Encounters:  12/26/23 124/80  06/20/23 120/76  12/13/22 120/70    Physical Exam Vitals and nursing note reviewed.  Constitutional:      General: She is not in acute distress.    Appearance: She is not diaphoretic.  HENT:     Head: Normocephalic and atraumatic.     Right Ear: External ear normal.     Left Ear: External ear normal.     Nose: Nose normal. No congestion or rhinorrhea.      Mouth/Throat:     Mouth: Mucous membranes are moist.  Eyes:     General:        Right eye: No discharge.        Left eye: No discharge.     Conjunctiva/sclera: Conjunctivae normal.     Pupils: Pupils are equal, round, and reactive to light.  Neck:     Thyroid: No thyromegaly.     Vascular: No JVD.  Cardiovascular:     Rate and Rhythm: Normal rate and regular rhythm.     Heart sounds: Normal heart sounds. No murmur heard.    No friction rub. No gallop.  Pulmonary:     Effort: Pulmonary effort is normal.     Breath sounds: Normal breath sounds. No wheezing, rhonchi or rales.  Abdominal:     General: Bowel sounds are normal.     Palpations: Abdomen is soft. There is no mass.     Tenderness: There is no abdominal tenderness. There is no guarding.  Musculoskeletal:        General: Normal range of motion.     Cervical back: Normal range of motion and neck supple.  Lymphadenopathy:     Cervical: No cervical adenopathy.  Skin:    General: Skin is warm and dry.  Neurological:     Mental Status: She is alert.     Wt Readings from Last 3 Encounters:  12/26/23 198 lb 6.4 oz (90 kg)  06/20/23 201 lb (91.2 kg)  02/06/23 189 lb (85.7 kg)    BP 124/80   Pulse (!) 58   Ht 5\' 6"  (1.676 m)   Wt 198 lb 6.4 oz (90 kg)   SpO2 98%   BMI 32.02 kg/m   Assessment and Plan:  1. Mixed hyperlipidemia Chronic.  Controlled.  Stable.  Asymptomatic without myalgias or muscle weakness.  Continue atorvastatin 10 mg once a day.  Will check current lipid status with lipid panel.  Will recheck in 6 months and encourage low-cholesterol low triglyceride dietary guidelines - atorvastatin (LIPITOR) 10 MG tablet; TAKE (1) TABLET BY MOUTH EVERY DAY  Dispense: 90 tablet; Refill: 1  2. Gastroesophageal reflux disease, unspecified whether esophagitis present Chronic.  Controlled.  Stable.  Asymptomatic.  Tolerating current dosing of omeprazole 40 mg once a day.  Will continue at current dosing.  Will recheck  in 6 months - omeprazole (PRILOSEC) 40 MG capsule; TAKE ONE (1) CAPSULE EACH DAY.  Dispense: 90  capsule; Refill: 1  3. Tremor Chronic.  Followed by neurology.  Sees Dr. Clelia Croft at [facility] and we will continue propranolol 80 mg twice a day at current dosing. - propranolol (INDERAL) 80 MG tablet; Take 1 tablet (80 mg total) by mouth 2 (two) times daily.  Dispense: 180 tablet; Refill: 1  4. Stomatitis herpetiformis Chronic.  Episodic.  Stable.  On as-needed basis on outbreaks patient takes Valtrex 500 mg by mouth twice a day. - valACYclovir (VALTREX) 500 MG tablet; TAKE (1) TABLET BY MOUTH TWICE DAILY AS NEEDED FOR FLARE UP  Dispense: 30 tablet; Refill: 5  5. Hypothyroidism, unspecified type (Primary) Chronic.  Controlled.  Stable.  Currently is taking dosing at 137 mcg daily.  Asymptomatic.  Tolerating medication.  Pending TSH we will continue at current dosing as above.  Will recheck in 6 months. - Comprehensive metabolic panel - Lipid Panel With LDL/HDL Ratio - TSH    Elizabeth Sauer, MD

## 2023-12-27 ENCOUNTER — Encounter: Payer: Self-pay | Admitting: Family Medicine

## 2023-12-27 LAB — COMPREHENSIVE METABOLIC PANEL
ALT: 26 [IU]/L (ref 0–32)
AST: 26 [IU]/L (ref 0–40)
Albumin: 4.2 g/dL (ref 3.9–4.9)
Alkaline Phosphatase: 89 [IU]/L (ref 44–121)
BUN/Creatinine Ratio: 14 (ref 12–28)
BUN: 10 mg/dL (ref 8–27)
Bilirubin Total: 0.6 mg/dL (ref 0.0–1.2)
CO2: 25 mmol/L (ref 20–29)
Calcium: 9.3 mg/dL (ref 8.7–10.3)
Chloride: 105 mmol/L (ref 96–106)
Creatinine, Ser: 0.7 mg/dL (ref 0.57–1.00)
Globulin, Total: 2.7 g/dL (ref 1.5–4.5)
Glucose: 101 mg/dL — ABNORMAL HIGH (ref 70–99)
Potassium: 4.6 mmol/L (ref 3.5–5.2)
Sodium: 145 mmol/L — ABNORMAL HIGH (ref 134–144)
Total Protein: 6.9 g/dL (ref 6.0–8.5)
eGFR: 95 mL/min/{1.73_m2} (ref >59)

## 2023-12-27 LAB — LIPID PANEL WITH LDL/HDL RATIO
Cholesterol, Total: 124 mg/dL (ref 100–199)
HDL: 37 mg/dL — ABNORMAL LOW (ref >39)
LDL Chol Calc (NIH): 67 mg/dL (ref 0–99)
LDL/HDL Ratio: 1.8 {ratio} (ref 0.0–3.2)
Triglycerides: 106 mg/dL (ref 0–149)
VLDL Cholesterol Cal: 20 mg/dL (ref 5–40)

## 2023-12-27 LAB — TSH: TSH: 1.05 u[IU]/mL (ref 0.450–4.500)

## 2024-06-11 ENCOUNTER — Ambulatory Visit: Payer: Self-pay

## 2024-06-11 NOTE — Telephone Encounter (Signed)
 FYI Only or Action Required?: FYI only for provider.  Patient was last seen in primary care on 12/26/2023 by Joshua Cathryne BROCKS, MD.  Called Nurse Triage reporting Cough.  Symptoms began Friday.  Interventions attempted: Nothing.  Symptoms are: stable.  Triage Disposition: See Physician Within 24 Hours  Patient/caregiver understands and will follow disposition?: No, refuses disposition  Wants to try saline nasal washing, Flonase and honey tonight       Copied from CRM #[id number]. Topic: Clinical - Red Word Triage >> Jun 11, 2024 11:37 AM Willma SAUNDERS wrote: Red Word that prompted transfer to Nurse Triage: Patient has had a persistent productive cough and can feel drainage down throat, currently had since Friday but has had the cough a few other times this year. Reason for Disposition  [1] Continuous (nonstop) coughing interferes with work or school AND [2] no improvement using cough treatment per Care Advice  Answer Assessment - Initial Assessment Questions 1. ONSET: When did the cough begin?      Friday  2. SEVERITY: How bad is the cough today?      Persistent cough 3. SPUTUM: Describe the color of your sputum (e.g., none, dry cough; clear, white, yellow, green)     Unable produce enough to cough up  4. HEMOPTYSIS: Are you coughing up any blood? If Yes, ask: How much? (e.g., flecks, streaks, tablespoons, etc.)     no 5. DIFFICULTY BREATHING: Are you having difficulty breathing? If Yes, ask: How bad is it? (e.g., mild, moderate, severe)      Normal  6. FEVER: Do you have a fever? If Yes, ask: What is your temperature, how was it measured, and when did it start?     no 7. CARDIAC HISTORY: Do you have any history of heart disease? (e.g., heart attack, congestive heart failure)      no 8. LUNG HISTORY: Do you have any history of lung disease?  (e.g., pulmonary embolus, asthma, emphysema)     no  10. OTHER SYMPTOMS: Do you have any other symptoms? (e.g.,  runny nose, wheezing, chest pain)       Post nasal drip, occasionally blocked nose 95 and above oxygen level, hoarse, runny nose  Protocols used: Cough - Acute Productive-A-AH

## 2024-07-27 ENCOUNTER — Other Ambulatory Visit: Payer: Self-pay

## 2024-07-27 DIAGNOSIS — E782 Mixed hyperlipidemia: Secondary | ICD-10-CM

## 2024-07-28 MED ORDER — ATORVASTATIN CALCIUM 10 MG PO TABS: ORAL_TABLET | ORAL | 0 refills | Status: DC

## 2024-07-31 ENCOUNTER — Other Ambulatory Visit: Payer: Self-pay | Admitting: Student

## 2024-07-31 DIAGNOSIS — K219 Gastro-esophageal reflux disease without esophagitis: Secondary | ICD-10-CM

## 2024-07-31 DIAGNOSIS — R251 Tremor, unspecified: Secondary | ICD-10-CM

## 2024-08-03 NOTE — Telephone Encounter (Signed)
 Requested medication (s) are due for refill today - yes  Requested medication (s) are on the active medication list -yes  Future visit scheduled -yes  Last refill: 12/26/23  Notes to clinic: all Rx filled by Dr Joshua- will need another provider to refill until appointment 08/28/24  Requested Prescriptions  Pending Prescriptions Disp Refills   propranolol  (INDERAL ) 80 MG tablet [Pharmacy Med Name: PROPRANOLOL  HYDROCHLORIDE 80MG  TABLET] 180 tablet 1    Sig: TAKE ONE (1) TABLET (80 MG TOTAL) BY MOUTH TWO (2) (TWO) TIMES DAILY.     Cardiovascular:  Beta Blockers Failed - 08/03/2024 10:36 AM      Failed - Valid encounter within last 6 months    Recent Outpatient Visits           7 months ago Hypothyroidism, unspecified type   [place] [facility], MD              Passed - Last BP in normal range    BP Readings from Last 1 Encounters:  12/26/23 124/80         Passed - Last Heart Rate in normal range    Pulse Readings from Last 1 Encounters:  12/26/23 (!) 58          levothyroxine  (SYNTHROID ) 137 MCG tablet [Pharmacy Med Name: LEVOTHYROXINE  SODIUM TABLET] 90 tablet 1    Sig: TAKE ONE (1) TABLET (137 MCG TOTAL) BY MOUTH DAILY BEFORE BREAKFAST.     Endocrinology:  Hypothyroid Agents Passed - 08/03/2024 10:36 AM      Passed - TSH in normal range and within 360 days    TSH  Date Value Ref Range Status  12/26/2023 1.050 0.450 - 4.500 uIU/mL Final         Passed - Valid encounter within last 12 months    Recent Outpatient Visits           7 months ago Hypothyroidism, unspecified type   [facility] Joshua Cathryne JAYSON, MD               omeprazole  (PRILOSEC) 40 MG capsule [Pharmacy Med Name: OMEPRAZOLE  40MG  CAPSULE DR] 90 capsule 1    Sig: TAKE ONE (1) CAPSULE EACH DAY.     Gastroenterology: Proton Pump Inhibitors Passed - 08/03/2024 10:36 AM      Passed -  Valid encounter within last 12 months    Recent Outpatient Visits           7 months ago Hypothyroidism, unspecified type   [place] [facility] Jones, Deanna C, MD                 Requested Prescriptions  Pending Prescriptions Disp Refills   propranolol  (INDERAL ) 80 MG tablet [Pharmacy Med Name: PROPRANOLOL  HYDROCHLORIDE 80MG  TABLET] 180 tablet 1    Sig: TAKE ONE (1) TABLET (80 MG TOTAL) BY MOUTH TWO (2) (TWO) TIMES DAILY.     Cardiovascular:  Beta Blockers Failed - 08/03/2024 10:36 AM      Failed - Valid encounter within last 6 months    Recent Outpatient Visits           7 months ago Hypothyroidism, unspecified type   [facility] Jones, Deanna C, MD              Passed - Last BP  in normal range    BP Readings from Last 1 Encounters:  12/26/23 124/80         Passed - Last Heart Rate in normal range    Pulse Readings from Last 1 Encounters:  12/26/23 (!) 58          levothyroxine  (SYNTHROID ) 137 MCG tablet [Pharmacy Med Name: LEVOTHYROXINE  SODIUM TABLET] 90 tablet 1    Sig: TAKE ONE (1) TABLET (137 MCG TOTAL) BY MOUTH DAILY BEFORE BREAKFAST.     Endocrinology:  Hypothyroid Agents Passed - 08/03/2024 10:36 AM      Passed - TSH in normal range and within 360 days    TSH  Date Value Ref Range Status  12/26/2023 1.050 0.450 - 4.500 uIU/mL Final         Passed - Valid encounter within last 12 months    Recent Outpatient Visits           7 months ago Hypothyroidism, unspecified type   [facility], MD               omeprazole  (PRILOSEC) 40 MG capsule [Pharmacy Med Name: OMEPRAZOLE  40MG  CAPSULE DR] 90 capsule 1    Sig: TAKE ONE (1) CAPSULE EACH DAY.     Gastroenterology: Proton Pump Inhibitors Passed - 08/03/2024 10:36 AM      Passed - Valid encounter within last 12 months    Recent  Outpatient Visits           7 months ago Hypothyroidism, unspecified type   [facility] Jones, Deanna C, MD

## 2024-08-04 NOTE — Telephone Encounter (Signed)
 Please review medication refill request

## 2024-08-28 ENCOUNTER — Encounter: Payer: Self-pay | Admitting: Student

## 2024-08-28 ENCOUNTER — Ambulatory Visit: Admitting: Student

## 2024-08-28 VITALS — BP 126/74 | HR 94 | Ht 66.0 in | Wt 207.0 lb

## 2024-08-28 DIAGNOSIS — Z8619 Personal history of other infectious and parasitic diseases: Secondary | ICD-10-CM | POA: Diagnosis not present

## 2024-08-28 DIAGNOSIS — E782 Mixed hyperlipidemia: Secondary | ICD-10-CM

## 2024-08-28 DIAGNOSIS — Z1211 Encounter for screening for malignant neoplasm of colon: Secondary | ICD-10-CM

## 2024-08-28 DIAGNOSIS — R1312 Dysphagia, oropharyngeal phase: Secondary | ICD-10-CM

## 2024-08-28 DIAGNOSIS — E559 Vitamin D deficiency, unspecified: Secondary | ICD-10-CM

## 2024-08-28 DIAGNOSIS — E039 Hypothyroidism, unspecified: Secondary | ICD-10-CM

## 2024-08-28 DIAGNOSIS — Z23 Encounter for immunization: Secondary | ICD-10-CM | POA: Diagnosis not present

## 2024-08-28 DIAGNOSIS — M8580 Other specified disorders of bone density and structure, unspecified site: Secondary | ICD-10-CM | POA: Insufficient documentation

## 2024-08-28 DIAGNOSIS — Z1231 Encounter for screening mammogram for malignant neoplasm of breast: Secondary | ICD-10-CM

## 2024-08-28 DIAGNOSIS — M85831 Other specified disorders of bone density and structure, right forearm: Secondary | ICD-10-CM

## 2024-08-28 DIAGNOSIS — K219 Gastro-esophageal reflux disease without esophagitis: Secondary | ICD-10-CM

## 2024-08-28 DIAGNOSIS — K621 Rectal polyp: Secondary | ICD-10-CM

## 2024-08-28 DIAGNOSIS — R739 Hyperglycemia, unspecified: Secondary | ICD-10-CM

## 2024-08-28 MED ORDER — ATORVASTATIN CALCIUM 10 MG PO TABS: ORAL_TABLET | ORAL | 1 refills | Status: AC

## 2024-08-28 MED ORDER — OMEPRAZOLE 40 MG PO CPDR: DELAYED_RELEASE_CAPSULE | ORAL | 1 refills | Status: AC

## 2024-08-28 MED ORDER — LEVOTHYROXINE SODIUM 137 MCG PO TABS
137.0000 ug | ORAL_TABLET | Freq: Every day | ORAL | 1 refills | Status: AC
Start: 2024-08-28 — End: ?

## 2024-08-28 NOTE — Assessment & Plan Note (Signed)
 Last flare was 2-3 years ago. Does well with valacyclovir  as needed. She will contact me if she has a flare.

## 2024-08-28 NOTE — Assessment & Plan Note (Signed)
 Describes coughing with soft much foods and thin liquids intermittently.  Feels better eating standing up. Feels food is suck in throat immediately after swallowing. Ongoing for several years. Had EDG with mild gastritis in 2018. Denies fever, chills, unintentional weight loss, voice change, dry mouth, stroke hx. Discussed further evaluation of barium swallow she is unsure about this. Will continue to follow up with her about this.

## 2024-08-28 NOTE — Assessment & Plan Note (Signed)
 No fx of fragility fracture. Is on chronic PPI. Check vitamin D today. Dicussed lifstyle modifications with weight bearing exercise and supplementation with vitamin D and calcium .

## 2024-08-28 NOTE — Assessment & Plan Note (Signed)
 Glucoses on past several metabolic panels >100. She endorses having a sweet tooth and eating sweets regularly. Discussed dietary modification. A1c today. RD referral if A1c is elevated.

## 2024-08-28 NOTE — Assessment & Plan Note (Addendum)
 Well controlled omeprazole  40 mg continue current medication. Discussed dietary modifications, frequently eat tomatoes.

## 2024-08-28 NOTE — Patient Instructions (Addendum)
 Please call to schedule your mammogram at [phone]    We hope you enjoyed your visit with our office! Your feedback means so much to our team, and it helps us  to continue providing the best care possible. If you had a positive experience, we'd love if you could share it by leaving us  a [employer] Review and also completing our patient survey that you'll receive soon.  Your kind words not only brighten our day but also help other patients feel confident in choosing our office for their care.  Thank you for being a part of our practice family!   Dr. [facility]  [facility] [address]  [place] [address] Office [phone]  Fax: [phone]'

## 2024-08-28 NOTE — Assessment & Plan Note (Signed)
 Well controlled on atorvastatin  10 mg daily. Continue current medication

## 2024-08-28 NOTE — Assessment & Plan Note (Signed)
 Overdue for colonoscopy last performed in 2018 recall 5 years. Referral to GI.

## 2024-08-28 NOTE — Assessment & Plan Note (Addendum)
 Currently on levothyroxine  137 mcg daily. TSH normal in 11/2022. Repeat TSH at next visit.

## 2024-08-28 NOTE — Progress Notes (Signed)
 Established Patient Office Visit  Subjective   Patient ID: Cheryl Campos, female    DOB: 23-Sep-1956  Age: 68 y.o. MRN: [id number]  Chief Complaint  Patient presents with   Medication Refill    Cheryl Campos with medical hx listed below presents today for transfer of care. Please refer to problem based charting for further details and assessment and plan of current problem and chronic medical conditions.   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 (Patient taking differently: Take 80 mg by mouth 2 (two) times daily.)   valACYclovir  (VALTREX ) 500 MG tablet TAKE (1) TABLET BY MOUTH TWICE DAILY AS NEEDED FOR FLARE UP (Patient taking differently: as needed. TAKE (1) TABLET BY MOUTH TWICE DAILY AS NEEDED FOR FLARE UP)   [DISCONTINUED] atorvastatin  (LIPITOR) 10 MG tablet TAKE (1) TABLET BY MOUTH EVERY DAY   [DISCONTINUED] levothyroxine  (SYNTHROID ) 137 MCG tablet TAKE ONE (1) TABLET (137 MCG TOTAL) BY MOUTH DAILY BEFORE BREAKFAST.   [DISCONTINUED] omeprazole  (PRILOSEC) 40 MG capsule TAKE ONE (1) CAPSULE EACH DAY.   atorvastatin  (LIPITOR) 10 MG tablet TAKE (1) TABLET BY MOUTH EVERY DAY   levothyroxine  (SYNTHROID ) 137 MCG tablet Take 1 tablet (137 mcg total) by mouth daily before breakfast.   omeprazole  (PRILOSEC) 40 MG capsule TAKE ONE (1) CAPSULE EACH DAY.   No facility-administered encounter medications on file as of 08/28/2024.    BP  126/74   Pulse 94   Ht 5' 6 (1.676 m)   Wt 207 lb (93.9 kg)   SpO2 96%   BMI 33.41 kg/m  BP Readings from Last 3 Encounters:  08/28/24 126/74  12/26/23 124/80  06/20/23 120/76    Physical Exam Constitutional:      Appearance: Normal appearance.  HENT:     Head: Normocephalic and atraumatic.  Eyes:     Extraocular Movements: Extraocular movements intact.     Conjunctiva/sclera: Conjunctivae normal.     Pupils: Pupils are equal, round, and reactive to light.  Neck:     Comments: No masses Cardiovascular:     Rate and Rhythm: Normal rate and regular rhythm.     Heart sounds: No murmur heard. Pulmonary:     Effort: Pulmonary effort is normal.     Breath sounds: No rhonchi or rales.  Abdominal:     General: Abdomen is flat. Bowel sounds are normal. There is no distension.     Palpations: Abdomen is soft.     Tenderness: There is no abdominal tenderness.  Musculoskeletal:        General: Normal range of motion.     Cervical back: No tenderness.     Right lower leg: No edema.     Left lower leg: No edema.  Lymphadenopathy:     Cervical: No cervical adenopathy.  Skin:    General: Skin is warm and dry.     Capillary Refill: Capillary refill  takes less than 2 seconds.  Neurological:     General: No focal deficit present.     Mental Status: She is alert and oriented to person, place, and time.  Psychiatric:        Mood and Affect: Mood normal.        Behavior: Behavior normal.        08/28/2024   11:53 AM 12/26/2023    9:06 AM 06/20/2023    8:28 AM  Depression screen PHQ 2/9  Decreased Interest 0 0 0  Down, Depressed, Hopeless 0 0 0  PHQ - 2 Score 0 0 0  Altered sleeping 1 1 0  Tired, decreased energy 0 0 0  Change in appetite 0 0 0  Feeling bad or failure about yourself  0 0 0  Trouble concentrating 0 0 0  Moving slowly or fidgety/restless 1 0 0  Suicidal thoughts 0 0 0  PHQ-9 Score 2 1 0  Difficult doing work/chores Not difficult at all Not difficult at all  Not difficult at all       08/28/2024   11:53 AM 12/26/2023    9:06 AM 06/20/2023    8:29 AM 12/13/2022    9:45 AM  GAD 7 : Generalized Anxiety Score  Nervous, Anxious, on Edge 0 0 0 0  Control/stop worrying 0 0 0 0  Worry too much - different things 0 1 0 0  Trouble relaxing 0 0 0 0  Restless 1 0 0 0  Easily annoyed or irritable 0 0 0 0  Afraid - awful might happen 0 0 0 0  Total GAD 7 Score 1 1 0 0  Anxiety Difficulty Not difficult at all Not difficult at all Not difficult at all Not difficult at all    No results found for any visits on 08/28/24.  Last CBC Lab Results  Component Value Date   WBC 12.4 (H) 03/14/2017   HGB 13.5 03/14/2017   HCT 41.8 03/14/2017   MCV 88.2 03/14/2017   MCH 28.5 03/14/2017   RDW 13.2 03/14/2017   PLT 232 03/14/2017   Last metabolic panel Lab Results  Component Value Date   GLUCOSE 101 (H) 12/26/2023   NA 145 (H) 12/26/2023   K 4.6 12/26/2023   CL 105 12/26/2023   CO2 25 12/26/2023   BUN 10 12/26/2023   CREATININE 0.70 12/26/2023   EGFR 95 12/26/2023   CALCIUM  9.3 12/26/2023   PHOS 4.7 (H) 06/12/2022   PROT 6.9 12/26/2023   ALBUMIN 4.2 12/26/2023   LABGLOB 2.7 12/26/2023   AGRATIO 1.6 12/13/2022   BILITOT 0.6 12/26/2023   ALKPHOS 89 12/26/2023   AST 26 12/26/2023   ALT 26 12/26/2023   ANIONGAP 9 03/14/2017   Last lipids Lab Results  Component Value Date   CHOL 124 12/26/2023   HDL 37 (L) 12/26/2023   LDLCALC 67 12/26/2023   TRIG 106 12/26/2023   CHOLHDL 4.0 09/11/2019   Last hemoglobin A1c No results found for: HGBA1C Last thyroid  functions Lab Results  Component Value Date   TSH 1.050 12/26/2023   T4TOTAL 10.9 06/20/2023      The ASCVD Risk score (Arnett DK, et al., 2019) failed to calculate for the following reasons:   The valid total cholesterol range is 130 to 320 mg/dL    Assessment & Plan:  Hypothyroidism, unspecified type Assessment & Plan: Currently on levothyroxine  137 mcg daily. TSH normal in  11/2022. Repeat TSH at next visit.    Gastroesophageal reflux  disease, unspecified whether esophagitis present Assessment & Plan: Well controlled omeprazole  40 mg continue current medication. Discussed dietary modifications, frequently eat tomatoes.   Orders: -     Omeprazole ; TAKE ONE (1) CAPSULE EACH DAY.  Dispense: 90 capsule; Refill: 1  H/O cold sores Assessment & Plan: Last flare was 2-3 years ago. Does well with valacyclovir  as needed. She will contact me if she has a flare.    Mixed hyperlipidemia Assessment & Plan: Well controlled on atorvastatin  10 mg daily. Continue current medication   Orders: -     Atorvastatin  Calcium ; TAKE (1) TABLET BY MOUTH EVERY DAY  Dispense: 90 tablet; Refill: 1  Hyperglycemia Assessment & Plan: Glucoses on past several metabolic panels >100. She endorses having a sweet tooth and eating sweets regularly. Discussed dietary modification. A1c today. RD referral if A1c is elevated.   Orders: -     Hemoglobin A1c  Vitamin D deficiency -     VITAMIN D 25 Hydroxy (Vit-D Deficiency, Fractures)  Osteopenia of right forearm Assessment & Plan: No fx of fragility fracture. Is on chronic PPI. Check vitamin D today. Dicussed lifstyle modifications with weight bearing exercise and supplementation with vitamin D and calcium .    Screening for colorectal cancer -     Ambulatory referral to Gastroenterology  Screening mammogram for breast cancer -     3D Screening Mammogram, Left and Right  Encounter for immunization -     Flu vaccine HIGH DOSE PF(Fluzone Trivalent)  Oropharyngeal dysphagia Assessment & Plan: Describes coughing with soft much foods and thin liquids intermittently.  Feels better eating standing up. Feels food is suck in throat immediately after swallowing. Ongoing for several years. Had EDG with mild gastritis in 2018. Denies fever, chills, unintentional weight loss, voice change, dry mouth, stroke hx. Discussed further evaluation of  barium swallow she is unsure about this. Will continue to follow up with her about this.   Rectal polyp Assessment & Plan: Overdue for colonoscopy last performed in 2018 recall 5 years. Referral to GI.    Other orders -     Levothyroxine  Sodium; Take 1 tablet (137 mcg total) by mouth daily before breakfast.  Dispense: 90 tablet; Refill: 1     Return in about 5 months (around 01/26/2025) for physical.    Harlene Saddler, MD

## 2024-09-10 ENCOUNTER — Ambulatory Visit: Payer: Self-pay | Admitting: Student

## 2024-09-10 LAB — VITAMIN D 25 HYDROXY (VIT D DEFICIENCY, FRACTURES): Vit D, 25-Hydroxy: 28.3 ng/mL — ABNORMAL LOW (ref 30.0–100.0)

## 2024-09-10 LAB — HEMOGLOBIN A1C
Est. average glucose Bld gHb Est-mCnc: 117 mg/dL
Hgb A1c MFr Bld: 5.7 % — ABNORMAL HIGH (ref 4.8–5.6)

## 2024-11-03 ENCOUNTER — Ambulatory Visit
Admission: RE | Admit: 2024-11-03 | Discharge: 2024-11-03 | Disposition: A | Discharge status: Home / Self Care | Source: Ambulatory Visit | Attending: Student | Admitting: Student

## 2024-11-03 DIAGNOSIS — Z1231 Encounter for screening mammogram for malignant neoplasm of breast: Secondary | ICD-10-CM | POA: Insufficient documentation

## 2024-11-05 ENCOUNTER — Telehealth: Payer: Self-pay

## 2024-11-05 ENCOUNTER — Other Ambulatory Visit: Payer: Self-pay

## 2024-11-05 DIAGNOSIS — Z860100 Personal history of colon polyps, unspecified: Secondary | ICD-10-CM

## 2024-11-05 MED ORDER — NA SULFATE-K SULFATE-MG SULF 17.5-3.13-1.6 GM/177ML PO SOLN: 1.0000 | Freq: Once | ORAL | 0 refills | Status: AC

## 2024-11-05 NOTE — Telephone Encounter (Signed)
 Gastroenterology Pre-Procedure Review  Request Date: 12/02/24 Requesting Physician: Dr. Melany  PATIENT REVIEW QUESTIONS: The patient responded to the following health history questions as indicated:    1. Are you having any GI issues? no 2. Do you have a personal history of Polyps? yes (last colonoscopy performed by Dr. Jinny 07/29/17 recommended repeat in 5 years) 3. Do you have a family history of Colon Cancer or Polyps? no 4. Diabetes Mellitus? no 5. Joint replacements in the past 12 months?no 6. Major health problems in the past 3 months?no 7. Any artificial heart valves, MVP, or defibrillator?no    MEDICATIONS & ALLERGIES:    Patient reports the following regarding taking any anticoagulation/antiplatelet therapy:   Plavix, Coumadin, Eliquis, Xarelto, Lovenox, Pradaxa, Brilinta, or Effient? no Aspirin? no  Patient confirms/reports the following medications:  Current Outpatient Medications  Medication Sig Dispense Refill   atorvastatin  (LIPITOR) 10 MG tablet TAKE (1) TABLET BY MOUTH EVERY DAY 90 tablet 1   levothyroxine  (SYNTHROID ) 137 MCG tablet Take 1 tablet (137 mcg total) by mouth daily before breakfast. 90 tablet 1   omeprazole  (PRILOSEC) 40 MG capsule TAKE ONE (1) CAPSULE EACH DAY. 90 capsule 1   propranolol  (INDERAL ) 80 MG tablet TAKE ONE (1) TABLET (80 MG TOTAL) BY MOUTH TWO (2) (TWO) TIMES DAILY. (Patient taking differently: Take 80 mg by mouth 2 (two) times daily.) 60 tablet 0   valACYclovir  (VALTREX ) 500 MG tablet TAKE (1) TABLET BY MOUTH TWICE DAILY AS NEEDED FOR FLARE UP (Patient taking differently: as needed. TAKE (1) TABLET BY MOUTH TWICE DAILY AS NEEDED FOR FLARE UP) 30 tablet 5   No current facility-administered medications for this visit.    Patient confirms/reports the following allergies:  Allergies[1]  No orders of the defined types were placed in this encounter.   AUTHORIZATION INFORMATION Primary Insurance: 1D#: Group #:  Secondary  Insurance: 1D#: Group #:  SCHEDULE INFORMATION: Date: 12/02/24 Time: Location: [facility]    [1] No Known Allergies

## 2024-11-30 ENCOUNTER — Encounter: Payer: Self-pay | Admitting: Gastroenterology

## 2024-12-02 ENCOUNTER — Other Ambulatory Visit: Payer: Self-pay

## 2024-12-02 ENCOUNTER — Encounter: Payer: Self-pay | Admitting: Gastroenterology

## 2024-12-02 ENCOUNTER — Encounter
Admission: RE | Disposition: A | Discharge status: Home / Self Care | Payer: Self-pay | Source: Home / Self Care | Attending: Gastroenterology

## 2024-12-02 ENCOUNTER — Ambulatory Visit
Admission: RE | Admit: 2024-12-02 | Discharge: 2024-12-02 | Disposition: A | Discharge status: Home / Self Care | Source: Home / Self Care | Attending: Gastroenterology | Admitting: Gastroenterology

## 2024-12-02 DIAGNOSIS — Z1211 Encounter for screening for malignant neoplasm of colon: Secondary | ICD-10-CM

## 2024-12-02 DIAGNOSIS — K640 First degree hemorrhoids: Secondary | ICD-10-CM | POA: Diagnosis not present

## 2024-12-02 DIAGNOSIS — Z860101 Personal history of adenomatous and serrated colon polyps: Secondary | ICD-10-CM

## 2024-12-02 DIAGNOSIS — Z860100 Personal history of colon polyps, unspecified: Secondary | ICD-10-CM

## 2024-12-02 HISTORY — DX: Hypothyroidism, unspecified: E03.9

## 2024-12-02 HISTORY — DX: Myoneural disorder, unspecified: G70.9

## 2024-12-02 HISTORY — DX: Unspecified osteoarthritis, unspecified site: M19.90

## 2024-12-02 HISTORY — DX: Cardiac murmur, unspecified: R01.1

## 2024-12-02 MED ORDER — PROPOFOL 10 MG/ML IV BOLUS
INTRAVENOUS | Status: DC | PRN
  Administered 2024-12-02: 140 ug/kg/min via INTRAVENOUS
  Administered 2024-12-02: 50 mg via INTRAVENOUS

## 2024-12-02 MED ORDER — STERILE WATER FOR IRRIGATION IR SOLN
Status: DC | PRN
  Administered 2024-12-02: 1

## 2024-12-02 MED ORDER — LIDOCAINE HCL (CARDIAC) PF 100 MG/5ML IV SOSY
PREFILLED_SYRINGE | INTRAVENOUS | Status: DC | PRN
  Administered 2024-12-02: 60 mg via INTRAVENOUS

## 2024-12-02 MED ORDER — SODIUM CHLORIDE 0.9 % IV SOLN: INTRAVENOUS | Status: DC

## 2024-12-02 MED ORDER — PROPOFOL 1000 MG/100ML IV EMUL
INTRAVENOUS | Status: AC
  Filled 2024-12-02: qty 100

## 2024-12-02 MED ORDER — LACTATED RINGERS IV SOLN: INTRAVENOUS | Status: DC

## 2024-12-02 NOTE — Anesthesia Preprocedure Evaluation (Signed)
"                                    Anesthesia Evaluation  Patient identified by MRN, date of birth, ID band Patient awake    Reviewed: Allergy & Precautions, H&P , NPO status , Patient's Chart, lab work & pertinent test results  Airway Mallampati: II  TM Distance: >3 FB Neck ROM: Full    Dental no notable dental hx.    Pulmonary neg pulmonary ROS   Pulmonary exam normal breath sounds clear to auscultation       Cardiovascular negative cardio ROS Normal cardiovascular exam Rhythm:Regular Rate:Normal     Neuro/Psych negative neurological ROS  negative psych ROS   GI/Hepatic negative GI ROS, Neg liver ROS,,,  Endo/Other  negative endocrine ROS    Renal/GU negative Renal ROS  negative genitourinary   Musculoskeletal negative musculoskeletal ROS (+)    Abdominal   Peds negative pediatric ROS (+)  Hematology negative hematology ROS (+)   Anesthesia Other Findings   Reproductive/Obstetrics negative OB ROS                              Anesthesia Physical Anesthesia Plan  ASA: 2  Anesthesia Plan: MAC   Post-op Pain Management:    Induction: Intravenous  PONV Risk Score and Plan:   Airway Management Planned:   Additional Equipment:   Intra-op Plan:   Post-operative Plan: Extubation in OR  Informed Consent: I have reviewed the patients History and Physical, chart, labs and discussed the procedure including the risks, benefits and alternatives for the proposed anesthesia with the patient or authorized representative who has indicated his/her understanding and acceptance.     Dental advisory given  Plan Discussed with: CRNA  Anesthesia Plan Comments:         Anesthesia Quick Evaluation  "

## 2024-12-02 NOTE — H&P (Signed)
 "  Clotilda Schaffer, MD [facility] [address] [place], [place] [address] Phone:[phone] Fax : [phone]  Primary Care Physician:  Lemon Raisin, MD Primary Gastroenterologist:  Dr. Schaffer  Pre-Procedure History & Physical: HPI:  Cheryl Campos is a 69 y.o. female is here for a colonoscopy. There is a Phx of adenomatous polyp in the transverse in 2010.  Prior procedure? 2010 and 2018 Fhx CRC? No Blood thinners? No   Past Medical History:  Diagnosis Date   Arthritis    Dystonic tremor    head, neck   GERD (gastroesophageal reflux disease)    Heart murmur    AS A CHILD   Hypercholesteremia    Hypothyroidism    Neuromuscular disorder (HCC)    tremors in neck and head   Thyroid  disease    Tremors of nervous system     Past Surgical History:  Procedure Laterality Date   ADENOIDECTOMY     COLONOSCOPY  2008      COLONOSCOPY WITH PROPOFOL  N/A 07/29/2017   Procedure: COLONOSCOPY WITH PROPOFOL ;  Surgeon: Jinny Carmine, MD;  Location: [facility];  Service: Gastroenterology;  Laterality: N/A;   ESOPHAGOGASTRODUODENOSCOPY (EGD) WITH PROPOFOL  N/A 07/29/2017   Procedure: ESOPHAGOGASTRODUODENOSCOPY (EGD) WITH PROPOFOL ;  Surgeon: Jinny Carmine, MD;  Location: [facility];  Service: Gastroenterology;  Laterality: N/A;  requests early   POLYPECTOMY  07/29/2017   Procedure: POLYPECTOMY;  Surgeon: Jinny Carmine, MD;  Location: [facility];  Service: Gastroenterology;;    Prior to Admission medications  Medication Sig Start Date End Date Taking? Authorizing Provider  atorvastatin  (LIPITOR) 10 MG tablet TAKE (1) TABLET BY MOUTH EVERY DAY 08/28/24  Yes Lemon Raisin, MD  levothyroxine  (SYNTHROID ) 137 MCG tablet Take 1 tablet (137 mcg total) by mouth daily before breakfast. 08/28/24  Yes Lemon Raisin, MD  omeprazole  (PRILOSEC) 40 MG capsule TAKE ONE (1) CAPSULE EACH DAY. 08/28/24  Yes Lemon Raisin, MD  propranolol  (INDERAL ) 80 MG tablet TAKE ONE (1)  TABLET (80 MG TOTAL) BY MOUTH TWO (2) (TWO) TIMES DAILY. Patient taking differently: Take 80 mg by mouth 2 (two) times daily. 08/04/24  Yes Lemon Raisin, MD  valACYclovir  (VALTREX ) 500 MG tablet TAKE (1) TABLET BY MOUTH TWICE DAILY AS NEEDED FOR FLARE UP Patient taking differently: as needed. TAKE (1) TABLET BY MOUTH TWICE DAILY AS NEEDED FOR FLARE UP 12/26/23  Yes Joshua Cathryne BROCKS, MD    Allergies as of 11/05/2024   (No Known Allergies)    Family History  Problem Relation Age of Onset   Cancer Father    Heart disease Father    Cancer Brother    Diabetes Paternal Grandmother    Lung cancer Sister    Breast cancer Neg Hx     Social History   Socioeconomic History   Marital status: Married    Spouse name: Not on file   Number of children: Not on file   Years of education: Not on file   Highest education level: Bachelor's degree (e.g., BA, AB, BS)  Occupational History   Not on file  Tobacco Use   Smoking status: Never   Smokeless tobacco: Never  Vaping Use   Vaping status: Never Used  Substance and Sexual Activity   Alcohol use: No   Drug use: No   Sexual activity: Not Currently    Birth control/protection: Post-menopausal  Other Topics Concern   Not on file  Social History Narrative   Not on file   Social Drivers of Health  Tobacco Use: Low Risk (12/02/2024)   Patient History    Smoking Tobacco Use: Never    Smokeless Tobacco Use: Never    Passive Exposure: Not on file  Financial Resource Strain: Low Risk (12/22/2023)   Overall Financial Resource Strain (CARDIA)    Difficulty of Paying Living Expenses: Not hard at all  Food Insecurity: No Food Insecurity (12/22/2023)   Hunger Vital Sign    Worried About Running Out of Food in the Last Year: Never true    Ran Out of Food in the Last Year: Never true  Transportation Needs: No Transportation Needs (12/22/2023)   PRAPARE - Administrator, Civil Service (Medical): No    Lack of Transportation  (Non-Medical): No  Physical Activity: Unknown (12/22/2023)   Exercise Vital Sign    Days of Exercise per Week: Patient declined    Minutes of Exercise per Session: Not on file  Stress: No Stress Concern Present (12/22/2023)   [employer] of Occupational Health - Occupational Stress Questionnaire    Feeling of Stress : Only a little  Social Connections: Moderately Integrated (12/22/2023)   Social Connection and Isolation Panel    Frequency of Communication with Friends and Family: More than three times a week    Frequency of Social Gatherings with Friends and Family: More than three times a week    Attends Religious Services: More than 4 times per year    Active Member of [employer] or Organizations: No    Attends Banker Meetings: Not on file    Marital Status: Married  Intimate Partner Violence: Not At Risk (08/28/2024)   Epic    Fear of Current or Ex-Partner: No    Emotionally Abused: No    Physically Abused: No    Sexually Abused: No  Depression (PHQ2-9): Low Risk (08/28/2024)   Depression (PHQ2-9)    PHQ-2 Score: 2  Alcohol Screen: Low Risk (02/06/2023)   Alcohol Screen    Last Alcohol Screening Score (AUDIT): 0  Housing: Unknown (09/01/2024)   Received from [facility]   Epic    Unable to Pay for Housing in the Last Year: Not on file    Number of Times Moved in the Last Year: Not on file    At any time in the past 12 months, were you homeless or living in a shelter (including now)?: No  Utilities: Not At Risk (08/28/2024)   Epic    Threatened with loss of utilities: No  Health Literacy: Not on file    Review of Systems: See HPI, otherwise negative ROS  Physical Exam: BP (!) 142/68   Temp 98.4 F (36.9 C) (Temporal)   Resp 15   Ht 5' 4.02 (1.626 m)   Wt 95 kg   SpO2 97%   BMI 35.93 kg/m  CONSTITUTIONAL: Well-appearing in no acute distress.  HEENT: Pupils equal, round, Extraocular movements intact. Conjunctivae clear NECK: Neck  supple CARDIOVASCULAR: Regular rate, no LE edema  RESPIRATORY: No labored breathing  ABDOMEN: Abdomen soft, nontender, not distended, no guarding, no rigidity SKIN: No apparent skin rashes or lesions. NEUROLOGIC: Normal speech, no focal findings. Mental status alert and oriented x4. PSYCHIATRIC: Mood and affect normal.   Impression/Plan: Cheryl Campos is here for an colonoscopy to be performed for Phx of adenomatous polyp in the transverse in 2018.  Risks, benefits, limitations, and alternatives regarding  colonoscopy have been reviewed with the patient.  Questions have been answered.  All parties agreeable.  MELANY CLOTILDA HERO, MD  12/02/2024, 7:20 AM  "

## 2024-12-02 NOTE — Transfer of Care (Signed)
 Immediate Anesthesia Transfer of Care Note  Patient: Cheryl Campos  Procedure(s) Performed: COLONOSCOPY  Patient Location: PACU  Anesthesia Type: MAC  Level of Consciousness: awake, alert  and patient cooperative  Airway and Oxygen Therapy: Patient Spontanous Breathing and Patient connected to supplemental oxygen  Post-op Assessment: Post-op Vital signs reviewed, Patient's Cardiovascular Status Stable, Respiratory Function Stable, Patent Airway and No signs of Nausea or vomiting  Post-op Vital Signs: Reviewed and stable  Complications: No notable events documented.

## 2024-12-02 NOTE — Anesthesia Postprocedure Evaluation (Signed)
"   Anesthesia Post Note  Patient: Cheryl Campos  Procedure(s) Performed: COLONOSCOPY (Rectum)  Patient location during evaluation: PACU Anesthesia Type: MAC Level of consciousness: awake and alert Pain management: pain level controlled Vital Signs Assessment: post-procedure vital signs reviewed and stable Respiratory status: spontaneous breathing, nonlabored ventilation, respiratory function stable and patient connected to nasal cannula oxygen Cardiovascular status: blood pressure returned to baseline and stable Postop Assessment: no apparent nausea or vomiting Anesthetic complications: no   No notable events documented.   Last Vitals:  Vitals:   12/02/24 0758 12/02/24 0800  BP: 100/64 103/60  Pulse: 67 64  Resp: 15 15  Temp:  (!) 36.4 C  SpO2: 99% 99%    Last Pain:  Vitals:   12/02/24 0800  TempSrc:   PainSc: 0-No pain                 Fairy A Kamon Fahr      "

## 2024-12-02 NOTE — Addendum Note (Signed)
 Addendum  created 12/02/24 0904 by Levy Harvey, CRNA   Intraprocedure Meds edited

## 2024-12-02 NOTE — Op Note (Addendum)
 [facility]astroenterology Patient Name: Cheryl Campos Procedure Date: 12/02/2024 7:10 AM MRN: [id number] Account #: [id number] Date of Birth: 21-Mar-1956 Admit Type: Outpatient Age: 69 Room: [facility] OR ROOM 01 Gender: Female Note Status: Finalized Instrument Name: Colonoscope [id number] Procedure:             Colonoscopy Indications:           High risk colon cancer surveillance: Personal history                         of colonic polyps Providers:             Clotilda Schaffer, MD Referring MD:          Harlene Saddler (Referring MD) Medicines:             Propofol  per Anesthesia Complications:         No immediate complications. Procedure:             Pre-Anesthesia Assessment:                        - Prior to the procedure, a History and Physical was                         performed, and patient medications and allergies were                         reviewed. The patient's tolerance of previous                         anesthesia was also reviewed. The risks and benefits                         of the procedure and the sedation options and risks                         were discussed with the patient. All questions were                         answered, and informed consent was obtained. Prior                         Anticoagulants: The patient has taken no anticoagulant                         or antiplatelet agents. ASA Grade Assessment: II - A                         patient with mild systemic disease. After reviewing                         the risks and benefits, the patient was deemed in                         satisfactory condition to undergo the procedure.                        After obtaining informed consent, the colonoscope was  passed under direct vision. Throughout the procedure,                         the patient's blood pressure, pulse, and oxygen                         saturations were monitored continuously. The                          Colonoscope was introduced through the anus and                         advanced to the the cecum, identified by appendiceal                         orifice and ileocecal valve. The colonoscopy was                         performed without difficulty. The patient tolerated                         the procedure well. The quality of the bowel                         preparation was good. The ileocecal valve, appendiceal                         orifice, and rectum were photographed. Findings:      Non-bleeding internal hemorrhoids were found during retroflexion. The       hemorrhoids were Grade I (internal hemorrhoids that do not prolapse). Impression:            - Non-bleeding internal hemorrhoids.                        - No specimens collected. Recommendation:        - Patient has a contact number available for                         emergencies. The signs and symptoms of potential                         delayed complications were discussed with the patient.                         Return to normal activities tomorrow. Written                         discharge instructions were provided to the patient.                        - High fiber diet.                        - Continue present medications.                        - Repeat colonoscopy in 10 years for screening  purposes.                        - The findings and recommendations were discussed with                         the designated responsible adult. Procedure Code(s):     --- Professional ---                        H9894, Colorectal cancer screening; colonoscopy on                         individual at high risk Diagnosis Code(s):     --- Professional ---                        Z86.010, Personal history of colonic polyps                        K64.0, First degree hemorrhoids CPT copyright 2022 American Medical Association. All rights reserved. The codes documented in this report are  preliminary and upon coder review may  be revised to meet current compliance requirements. Clotilda Schaffer, MD 12/02/2024 7:53:17 AM Number of Addenda: 0 Note Initiated On: 12/02/2024 7:10 AM Scope Withdrawal Time: 0 hours 7 minutes 23 seconds  Total Procedure Duration: 0 hours 12 minutes 11 seconds  Estimated Blood Loss:  Estimated blood loss: none.      [facility]

## 2025-01-26 ENCOUNTER — Encounter: Admitting: Student
